# Patient Record
Sex: Male | Born: 1935 | Race: White | Hispanic: No | Smoking: Former smoker
Health system: Southern US, Community
[De-identification: ages and names within clinical notes are randomized; demographics above are authoritative.]

## PROBLEM LIST (undated history)

## (undated) DIAGNOSIS — R413 Other amnesia: Secondary | ICD-10-CM

## (undated) DIAGNOSIS — R269 Unspecified abnormalities of gait and mobility: Secondary | ICD-10-CM

## (undated) DIAGNOSIS — F0280 Dementia in other diseases classified elsewhere without behavioral disturbance: Secondary | ICD-10-CM

## (undated) DIAGNOSIS — G309 Alzheimer's disease, unspecified: Secondary | ICD-10-CM

## (undated) DIAGNOSIS — N400 Enlarged prostate without lower urinary tract symptoms: Secondary | ICD-10-CM

## (undated) DIAGNOSIS — R5383 Other fatigue: Secondary | ICD-10-CM

## (undated) DIAGNOSIS — R399 Unspecified symptoms and signs involving the genitourinary system: Secondary | ICD-10-CM

## (undated) HISTORY — PX: BONY PELVIS SURGERY: SHX572

## (undated) HISTORY — DX: Other amnesia: R41.3

## (undated) HISTORY — DX: Unspecified symptoms and signs involving the genitourinary system: R39.9

## (undated) HISTORY — PX: FOOT SURGERY: SHX648

## (undated) HISTORY — DX: Unspecified abnormalities of gait and mobility: R26.9

## (undated) HISTORY — DX: Other fatigue: R53.83

## (undated) HISTORY — DX: Benign prostatic hyperplasia without lower urinary tract symptoms: N40.0

---

## 2012-11-07 ENCOUNTER — Emergency Department: Payer: Self-pay | Admitting: Emergency Medicine

## 2014-07-15 DIAGNOSIS — R5383 Other fatigue: Secondary | ICD-10-CM | POA: Diagnosis not present

## 2014-07-15 DIAGNOSIS — N401 Enlarged prostate with lower urinary tract symptoms: Secondary | ICD-10-CM | POA: Diagnosis not present

## 2014-07-15 DIAGNOSIS — N400 Enlarged prostate without lower urinary tract symptoms: Secondary | ICD-10-CM | POA: Diagnosis not present

## 2014-11-19 DIAGNOSIS — Z1389 Encounter for screening for other disorder: Secondary | ICD-10-CM | POA: Diagnosis not present

## 2014-11-19 DIAGNOSIS — Z136 Encounter for screening for cardiovascular disorders: Secondary | ICD-10-CM | POA: Diagnosis not present

## 2014-11-19 DIAGNOSIS — Z131 Encounter for screening for diabetes mellitus: Secondary | ICD-10-CM | POA: Diagnosis not present

## 2014-11-19 DIAGNOSIS — R42 Dizziness and giddiness: Secondary | ICD-10-CM | POA: Diagnosis not present

## 2014-11-19 DIAGNOSIS — R413 Other amnesia: Secondary | ICD-10-CM | POA: Diagnosis not present

## 2014-12-03 DIAGNOSIS — Z6824 Body mass index (BMI) 24.0-24.9, adult: Secondary | ICD-10-CM | POA: Diagnosis not present

## 2014-12-03 DIAGNOSIS — F99 Mental disorder, not otherwise specified: Secondary | ICD-10-CM | POA: Diagnosis not present

## 2014-12-03 DIAGNOSIS — R42 Dizziness and giddiness: Secondary | ICD-10-CM | POA: Diagnosis not present

## 2014-12-03 DIAGNOSIS — E860 Dehydration: Secondary | ICD-10-CM | POA: Diagnosis not present

## 2014-12-24 ENCOUNTER — Ambulatory Visit (INDEPENDENT_AMBULATORY_CARE_PROVIDER_SITE_OTHER): Payer: Medicare Other | Admitting: Neurology

## 2014-12-24 ENCOUNTER — Encounter: Payer: Self-pay | Admitting: Neurology

## 2014-12-24 VITALS — BP 137/93 | HR 107 | Ht 63.0 in | Wt 150.8 lb

## 2014-12-24 DIAGNOSIS — R269 Unspecified abnormalities of gait and mobility: Secondary | ICD-10-CM

## 2014-12-24 DIAGNOSIS — R413 Other amnesia: Secondary | ICD-10-CM

## 2014-12-24 HISTORY — DX: Unspecified abnormalities of gait and mobility: R26.9

## 2014-12-24 HISTORY — DX: Other amnesia: R41.3

## 2014-12-24 NOTE — Progress Notes (Signed)
Reason for visit: Memory disorder  Referring physician: Jarmon Javid is a 81 y.o. male  History of present illness:  Mr. Hedberg is an 81 year old right-handed white male with a history of a progressive memory disorder over the last 6-12 months prior to this evaluation. The patient lives with his son, and his daughter lives next door. He has had increasing issues with short-term memory. 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 The patient has had difficulty with knowing where his house is when he leaves his daughter's house even know he lives next door. He requires assistance to manage his finances, and his daughter has had to help him with medications and appointments recently. The patient has had some episodes of dizziness, with a tendency to veer while walking. The patient has had blood work that reveals a normal B12 level. He feels somewhat weak and fatigued. The patient denies any issues controlling the bowels or the bladder. He denies any focal numbness or weakness of the face, arms, or legs. He is sent to this office for an evaluation.  Past Medical History  Diagnosis Date  . BPH (benign prostatic hyperplasia)   . Memory loss   . Memory deficits 12/24/2014  . Abnormality of gait 12/24/2014    Past Surgical History  Procedure Laterality Date  . Bony pelvis surgery      Family History  Problem Relation Age of Onset  . Cancer Sister   . Cancer Sister     Social history:  reports that he quit smoking about 58 years ago. His smoking use included Cigarettes. He has a 20 pack-year smoking history. He does not have any smokeless tobacco history on file. His alcohol and drug histories are not on file.  Medications:  Prior to Admission medications   Medication Sig Start Date End Date Taking? Authorizing Provider  aspirin  325 MG tablet Take 325 mg by mouth as needed.   Yes Historical Provider, MD  Calcium Carbonate-Vitamin D (CALCIUM-VITAMIN D) 600-125 MG-UNIT TABS Take 1 tablet by mouth daily.   Yes Historical Provider, MD  tamsulosin (FLOMAX) 0.4 MG CAPS capsule Take 0.4 mg by mouth daily.   Yes Historical Provider, MD     No Known Allergies  ROS:  Out of a complete 14 system review of symptoms, the patient complains only of the following symptoms, and all other reviewed systems are negative.  Fatigue Hearing loss, ringing in the ears, spinning sensations Shortness of breath Easy bruising Feeling cold, increased thirst Memory loss, confusion, numbness, weakness, slurred speech, dizziness Decreased energy, disinterest in activities Snoring  Blood pressure 137/93, pulse 107, height 5\' 3"  (1.6 m), weight 150 lb 12.8 oz (68.402 kg).  Physical Exam  General: The patient is alert and cooperative at the time of the examination.  Eyes: Pupils are equal, round, and reactive to light. Discs are flat bilaterally.  Neck: The neck is supple, no carotid bruits are noted.  Respiratory: The respiratory examination is clear.  Cardiovascular: The cardiovascular examination reveals a regular rate and rhythm, no obvious murmurs or rubs are noted.  Skin: Extremities are without significant edema.  Neurologic Exam  Mental status: The patient is alert and oriented x 2 at the time of the examination (not oriented to date). The Mini-Mental Status Examination done today shows a total score  of 18/30.   Cranial nerves: Facial symmetry is present. There is good sensation of the face to pinprick and soft touch bilaterally. The strength of the facial muscles and the muscles to head turning and shoulder shrug are normal bilaterally. Speech is well enunciated, no aphasia or dysarthria is noted. Extraocular movements are full. Visual fields are full. The tongue is midline, and the patient has symmetric elevation of the soft  palate. No obvious hearing deficits are noted.  Motor: The motor testing reveals 5 over 5 strength of all 4 extremities. Good symmetric motor tone is noted throughout.  Sensory: Sensory testing is intact to pinprick, soft touch, vibration sensation, and position sense on all 4 extremities, with exception that there is a stocking pattern pinprick sensory deficit up to the knees bilaterally and vibration sensation is impaired in both feet. No evidence of extinction is noted.  Coordination: Cerebellar testing reveals good finger-nose-finger and heel-to-shin bilaterally.  Gait and station: Gait is normal. Tandem gait is unsteady. Romberg is negative. No drift is seen.  Reflexes: Deep tendon reflexes are symmetric, but are depressed bilaterally. Toes are downgoing bilaterally.   Assessment/Plan  1. Progressive memory disturbance  2. Gait disorder  The patient is having issues with balance, some dizziness, as well as some memory issues. The patient continues to operate a motor vehicle, I have recommended that he not drive at this time. The patient will be sent for MRI evaluation of the brain. I discussed the possibility of going on medication for memory, the patient is not interested in doing this at this time. He will follow-up in 6 months for an evaluation.   Marlan Palau MD 12/24/2014 7:33 PM  [facility] [address] [place], [place] [address]  Phone [phone] Fax [phone]

## 2014-12-24 NOTE — Patient Instructions (Addendum)
We will check a MRI of the brain, if you decide you want to go on a medication for memory, please let me know.   Fall Prevention and Home Safety Falls cause injuries and can affect all age groups. It is possible to use preventive measures to significantly decrease the likelihood of falls. There are many simple measures which can make your home safer and prevent falls. OUTDOORS  Repair cracks and edges of walkways and driveways.  Remove high doorway thresholds.  Trim shrubbery on the main path into your home.  Have good outside lighting.  Clear walkways of tools, rocks, debris, and clutter.  Check that handrails are not broken and are securely fastened. Both sides of steps should have handrails.  Have leaves, snow, and ice cleared regularly.  Use sand or salt on walkways during winter months.  In the garage, clean up grease or oil spills. BATHROOM  Install night lights.  Install grab bars by the toilet and in the tub and shower.  Use non-skid mats or decals in the tub or shower.  Place a plastic non-slip stool in the shower to sit on, if needed.  Keep floors dry and clean up all water on the floor immediately.  Remove soap buildup in the tub or shower on a regular basis.  Secure bath mats with non-slip, double-sided rug tape.  Remove throw rugs and tripping hazards from the floors. BEDROOMS  Install night lights.  Make sure a bedside light is easy to reach.  Do not use oversized bedding.  Keep a telephone by your bedside.  Have a firm chair with side arms to use for getting dressed.  Remove throw rugs and tripping hazards from the floor. KITCHEN  Keep handles on pots and pans turned toward the center of the stove. Use back burners when possible.  Clean up spills quickly and allow time for drying.  Avoid walking on wet floors.  Avoid hot utensils and knives.  Position shelves so they are not too high or low.  Place commonly used objects within easy  reach.  If necessary, use a sturdy step stool with a grab bar when reaching.  Keep electrical cables out of the way.  Do not use floor polish or wax that makes floors slippery. If you must use wax, use non-skid floor wax.  Remove throw rugs and tripping hazards from the floor. STAIRWAYS  Never leave objects on stairs.  Place handrails on both sides of stairways and use them. Fix any loose handrails. Make sure handrails on both sides of the stairways are as long as the stairs.  Check carpeting to make sure it is firmly attached along stairs. Make repairs to worn or loose carpet promptly.  Avoid placing throw rugs at the top or bottom of stairways, or properly secure the rug with carpet tape to prevent slippage. Get rid of throw rugs, if possible.  Have an electrician put in a light switch at the top and bottom of the stairs. OTHER FALL PREVENTION TIPS  Wear low-heel or rubber-soled shoes that are supportive and fit well. Wear closed toe shoes.  When using a stepladder, make sure it is fully opened and both spreaders are firmly locked. Do not climb a closed stepladder.  Add color or contrast paint or tape to grab bars and handrails in your home. Place contrasting color strips on first and last steps.  Learn and use mobility aids as needed. Install an electrical emergency response system.  Turn on lights to avoid  dark areas. Replace light bulbs that burn out immediately. Get light switches that glow.  Arrange furniture to create clear pathways. Keep furniture in the same place.  Firmly attach carpet with non-skid or double-sided tape.  Eliminate uneven floor surfaces.  Select a carpet pattern that does not visually hide the edge of steps.  Be aware of all pets. OTHER HOME SAFETY TIPS  Set the water temperature for 120 F (48.8 C).  Keep emergency numbers on or near the telephone.  Keep smoke detectors on every level of the home and near sleeping areas. Document Released:  05/05/2002 Document Revised: 11/14/2011 Document Reviewed: 08/04/2011 [facility] Patient Information 2015 [employer] [place], [place]. This information is not intended to replace advice given to you by your health care provider. Make sure you discuss any questions you have with your health care provider.

## 2014-12-25 ENCOUNTER — Telehealth: Payer: Self-pay

## 2014-12-25 NOTE — Telephone Encounter (Signed)
Office note faxed to Heide Guile at [phone].

## 2015-01-04 ENCOUNTER — Inpatient Hospital Stay: Admission: RE | Admit: 2015-01-04 | Payer: Self-pay | Source: Ambulatory Visit

## 2015-02-19 DIAGNOSIS — R413 Other amnesia: Secondary | ICD-10-CM | POA: Diagnosis not present

## 2015-02-19 DIAGNOSIS — R42 Dizziness and giddiness: Secondary | ICD-10-CM | POA: Diagnosis not present

## 2015-02-19 DIAGNOSIS — Z1389 Encounter for screening for other disorder: Secondary | ICD-10-CM | POA: Diagnosis not present

## 2015-02-19 DIAGNOSIS — Z6824 Body mass index (BMI) 24.0-24.9, adult: Secondary | ICD-10-CM | POA: Diagnosis not present

## 2015-02-19 DIAGNOSIS — Z23 Encounter for immunization: Secondary | ICD-10-CM | POA: Diagnosis not present

## 2015-02-19 DIAGNOSIS — Z9181 History of falling: Secondary | ICD-10-CM | POA: Diagnosis not present

## 2015-02-19 DIAGNOSIS — Z139 Encounter for screening, unspecified: Secondary | ICD-10-CM | POA: Diagnosis not present

## 2015-05-27 DIAGNOSIS — R631 Polydipsia: Secondary | ICD-10-CM | POA: Diagnosis not present

## 2015-05-27 DIAGNOSIS — G301 Alzheimer's disease with late onset: Secondary | ICD-10-CM | POA: Diagnosis not present

## 2015-05-27 DIAGNOSIS — G4700 Insomnia, unspecified: Secondary | ICD-10-CM | POA: Diagnosis not present

## 2015-05-27 DIAGNOSIS — F99 Mental disorder, not otherwise specified: Secondary | ICD-10-CM | POA: Diagnosis not present

## 2015-05-27 DIAGNOSIS — G309 Alzheimer's disease, unspecified: Secondary | ICD-10-CM | POA: Diagnosis not present

## 2015-05-27 DIAGNOSIS — Z23 Encounter for immunization: Secondary | ICD-10-CM | POA: Diagnosis not present

## 2015-06-16 ENCOUNTER — Encounter: Payer: Self-pay | Admitting: Emergency Medicine

## 2015-06-16 ENCOUNTER — Emergency Department: Payer: Medicare Other

## 2015-06-16 ENCOUNTER — Emergency Department
Admission: EM | Admit: 2015-06-16 | Discharge: 2015-06-16 | Disposition: A | Discharge status: Home / Self Care | Payer: Medicare Other | Attending: Emergency Medicine | Admitting: Emergency Medicine

## 2015-06-16 DIAGNOSIS — S52022A Displaced fracture of olecranon process without intraarticular extension of left ulna, initial encounter for closed fracture: Secondary | ICD-10-CM | POA: Diagnosis not present

## 2015-06-16 DIAGNOSIS — Z79899 Other long term (current) drug therapy: Secondary | ICD-10-CM | POA: Diagnosis not present

## 2015-06-16 DIAGNOSIS — Y998 Other external cause status: Secondary | ICD-10-CM | POA: Diagnosis not present

## 2015-06-16 DIAGNOSIS — S59902A Unspecified injury of left elbow, initial encounter: Secondary | ICD-10-CM | POA: Diagnosis present

## 2015-06-16 DIAGNOSIS — W108XXA Fall (on) (from) other stairs and steps, initial encounter: Secondary | ICD-10-CM | POA: Insufficient documentation

## 2015-06-16 DIAGNOSIS — Y9301 Activity, walking, marching and hiking: Secondary | ICD-10-CM | POA: Insufficient documentation

## 2015-06-16 DIAGNOSIS — Y92009 Unspecified place in unspecified non-institutional (private) residence as the place of occurrence of the external cause: Secondary | ICD-10-CM | POA: Diagnosis not present

## 2015-06-16 DIAGNOSIS — Z87891 Personal history of nicotine dependence: Secondary | ICD-10-CM | POA: Diagnosis not present

## 2015-06-16 DIAGNOSIS — M25422 Effusion, left elbow: Secondary | ICD-10-CM | POA: Insufficient documentation

## 2015-06-16 DIAGNOSIS — Z7982 Long term (current) use of aspirin: Secondary | ICD-10-CM | POA: Insufficient documentation

## 2015-06-16 HISTORY — DX: Alzheimer's disease, unspecified: G30.9

## 2015-06-16 HISTORY — DX: Dementia in other diseases classified elsewhere, unspecified severity, without behavioral disturbance, psychotic disturbance, mood disturbance, and anxiety: F02.80

## 2015-06-16 MED ORDER — HYDROCODONE-ACETAMINOPHEN 5-325 MG PO TABS: 1.0000 | ORAL_TABLET | Freq: Four times a day (QID) | ORAL | Status: DC | PRN

## 2015-06-16 NOTE — ED Notes (Signed)
Patient presents to the ED with very swollen left elbow post fall.  Patient states he was carrying firewood up several steps and tripped and fell backward.  Patient denies losing consciousness and denies hitting his head.  Patient denies pain from the elbow and denies tenderness to touch but reports feeling slightly numb in his fingers on his left hand.  Mobility intact in fingers.

## 2015-06-16 NOTE — ED Notes (Signed)
Patient transported to X-ray 

## 2015-06-16 NOTE — Discharge Instructions (Signed)
Cast or Splint Care Casts and splints support injured limbs and keep bones from moving while they heal.  HOME CARE  Keep the cast or splint uncovered during the drying period.  A plaster cast can take 24 to 48 hours to dry.  A fiberglass cast will dry in less than 1 hour.  Do not rest the cast on anything harder than a pillow for 24 hours.  Do not put weight on your injured limb. Do not put pressure on the cast. Wait for your doctor's approval.  Keep the cast or splint dry.  Cover the cast or splint with a plastic bag during baths or wet weather.  If you have a cast over your chest and belly (trunk), take sponge baths until the cast is taken off.  If your cast gets wet, dry it with a towel or blow dryer. Use the cool setting on the blow dryer.  Keep your cast or splint clean. Wash a dirty cast with a damp cloth.  Do not put any objects under your cast or splint.  Do not scratch the skin under the cast with an object. If itching is a problem, use a blow dryer on a cool setting over the itchy area.  Do not trim or cut your cast.  Do not take out the padding from inside your cast.  Exercise your joints near the cast as told by your doctor.  Raise (elevate) your injured limb on 1 or 2 pillows for the first 1 to 3 days. GET HELP IF:  Your cast or splint cracks.  Your cast or splint is too tight or too loose.  You itch badly under the cast.  Your cast gets wet or has a soft spot.  You have a bad smell coming from the cast.  You get an object stuck under the cast.  Your skin around the cast becomes red or sore.  You have new or more pain after the cast is put on. GET HELP RIGHT AWAY IF:  You have fluid leaking through the cast.  You cannot move your fingers or toes.  Your fingers or toes turn blue or white or are cool, painful, or puffy (swollen).  You have tingling or lose feeling (numbness) around the injured area.  You have bad pain or pressure under the  cast.  You have trouble breathing or have shortness of breath.  You have chest pain.   This information is not intended to replace advice given to you by your health care provider. Make sure you discuss any questions you have with your health care provider.   Document Released: 09/14/2010 Document Revised: 01/15/2013 Document Reviewed: 11/21/2012 Elsevier Interactive Patient Education 2016 [employer]  ELBOW FRACTURE  An elbow fracture is a break in a bone or bones of your elbow. Your elbow is a hinged joint that is made up of three bones. These bones are the long bone in your upper arm (humerus), the bone in the outer part of your lower arm (radius), and the bone in the inner part of your lower arm (ulna). The tip of your elbow (olecranon) is part of your ulna. If the fracture is displaced, that means that the bones are not lined up correctly and may be unstable. The bones will be put back into position with a procedure that is called open reduction with internal fixation (ORIF). A combination of metal pins, screws with or without a metal plate, or different types of wiring are used to hold the  bones in place. LET [facility] CARE PROVIDER KNOW ABOUT:  Any allergies you have.  All medicines you are taking, including vitamins, herbs, eye drops, creams, and over-the-counter medicines.  Previous problems you or members of your family have had with the use of anesthetics.  Any blood disorders you have.  Previous surgeries you have had.  Any medical conditions you may have. RISKS AND COMPLICATIONS Generally, this is a safe procedure. However, problems may occur, including:  Bleeding.  Infection.  Elbow stiffness.  Nerve damage.  Blood vessel damage.  Loosening or breaking of the screws or plates that are used for internal fixation.  Failure of the fracture to heal properly.  Need to have surgery again. BEFORE THE PROCEDURE  Ask your health care provider  about:  Changing or stopping your regular medicines. This is especially important if you are taking diabetes medicines or blood thinners.  Taking medicines such as aspirin and ibuprofen. These medicines can thin your blood. Do not take these medicines before your procedure if your health care provider instructs you not to.  Do not drink alcohol before your procedure or as directed by your health care provider.  Do not use any tobacco products, including cigarettes, chewing tobacco, or e-cigarettes before the procedure or as directed by your health care provider. If you need help quitting, ask your health care provider. Using tobacco products can delay or prevent bone healing.  Follow instructions from your health care provider about eating or drinking restrictions.  Plan to have someone take you home after the procedure.  If you go home right after the procedure, plan to have someone with you for 24 hours. PROCEDURE  An IV tube will be inserted into one of your veins.  You will be given one or more of the following:  A medicine that helps you relax (sedative).  A medicine that makes you fall asleep (general anesthetic).  A medicine that is injected into an area of your body that numbs everything below the injection site (regional anesthetic).  Your arm will be cleaned with a germ-killing solution (antiseptic) and covered with sterile cloths.  Your surgeon will make an incision in your elbow over the fracture.  The structures around your elbow will be moved aside carefully to expose your fracture.  The bone pieces will be put back into their normal positions.  Your surgeon may use metal screws, wires, pins, or plates to hold the bone pieces in position.  After the bones are back in place, the incision will be closed with stitches (sutures) or staples.  A bandage (dressing) will be placed over the incision.  A splint may be placed on your arm. The procedure may vary among  health care providers and hospitals. AFTER THE PROCEDURE  Your blood pressure, heart rate, breathing rate, and blood oxygen level will be monitored often until the medicines you were given have worn off.  It is normal to have pain after the procedure. You will be given medicine for pain as needed.  You may be sent home with a sling to support your arm.   This information is not intended to replace advice given to you by your health care provider. Make sure you discuss any questions you have with your health care provider.   Document Released: 11/08/2000 Document Revised: 09/29/2014 Document Reviewed: 05/11/2014 Elsevier Interactive Patient Education [employer]  Keep the splint in place until evaluated by ortho. Give OTC Tylenol or Motrin for non-drowsy pain relief. Give the  Hydrocodone as needed for more severe pain. Consider give a stool softener (Miralax) along with the narcotic pain medicine.

## 2015-06-16 NOTE — ED Provider Notes (Signed)
[facility] Emergency Department Provider Note ____________________________________________  Time seen: 71  I have reviewed the triage vital signs and the nursing notes.  HISTORY  Chief Complaint  Joint Swelling  HPI Johnathan Foster is a 82 y.o. male presents to the ED, accompanied by his adult daughter for evaluation of injury to his left elbow. He was carrying wood into the house for the fireplace, when he accidentally fell while trying to walk up the steps. He landed backwards landing on his left elbow primarily. His daughter noted immediate swelling to the elbow. He denies any other injury including injury to the head, back, or any lacerations. There was no reported loss of consciousness, nausea, vomiting.  He denies any significant pain at this time.   Past Medical History  Diagnosis Date  . BPH (benign prostatic hyperplasia)   . Memory loss   . Memory deficits 12/24/2014  . Abnormality of gait 12/24/2014  . Alzheimer's dementia     Patient Active Problem List   Diagnosis Date Noted  . Memory deficits 12/24/2014  . Abnormality of gait 12/24/2014    Past Surgical History  Procedure Laterality Date  . Bony pelvis surgery      Current Outpatient Rx  Name  Route  Sig  Dispense  Refill  . aspirin 325 MG tablet   Oral   Take 325 mg by mouth as needed.         . Calcium Carbonate-Vitamin D (CALCIUM-VITAMIN D) 600-125 MG-UNIT TABS   Oral   Take 1 tablet by mouth daily.         Marland Kitchen HYDROcodone-acetaminophen (NORCO) 5-325 MG tablet   Oral   Take 1 tablet by mouth every 6 (six) hours as needed for moderate pain.   10 tablet   0   . tamsulosin (FLOMAX) 0.4 MG CAPS capsule   Oral   Take 0.4 mg by mouth daily.           Allergies Review of patient's allergies indicates no known allergies.  Family History  Problem Relation Age of Onset  . Cancer Sister   . Cancer Sister     Social History Social History  Substance Use Topics  .  Smoking status: Former Smoker -- 1.00 packs/day for 20 years    Types: Cigarettes    Quit date: 05/29/1956  . Smokeless tobacco: None  . Alcohol Use: None    Review of Systems  Constitutional: Negative for fever. Eyes: Negative for visual changes. ENT: Negative for sore throat. Cardiovascular: Negative for chest pain. Respiratory: Negative for shortness of breath. Gastrointestinal: Negative for abdominal pain, vomiting and diarrhea. Genitourinary: Negative for dysuria. Musculoskeletal: Negative for back pain. Left elbow pain as above. Skin: Negative for rash. Neurological: Negative for headaches, focal weakness or numbness. ____________________________________________  PHYSICAL EXAM:  VITAL SIGNS: ED Triage Vitals  Enc Vitals Group     BP 06/16/15 1852 139/92 mmHg     Pulse Rate 06/16/15 1852 92     Resp 06/16/15 1852 20     Temp 06/16/15 1852 97.7 F (36.5 C)     Temp Source 06/16/15 1852 Oral     SpO2 06/16/15 1852 100 %     Weight 06/16/15 1852 150 lb (68.04 kg)     Height 06/16/15 1852  (1.651 m)     Head Cir --      Peak Flow --      Pain Score 06/16/15 1853 0     Pain Loc --  Pain Edu? --      Excl. in GC? --    Constitutional: Alert and oriented. Well appearing and in no distress. Head: Normocephalic and atraumatic.      Eyes: Conjunctivae are normal. PERRL. Normal extraocular movements      Ears: Canals clear. TMs intact bilaterally.   Nose: No congestion/rhinorrhea.   Mouth/Throat: Mucous membranes are moist.   Neck: Supple. No thyromegaly. Hematological/Lymphatic/Immunological: No cervical lymphadenopathy. Cardiovascular: Normal rate, regular rhythm. Normal distal pulses left upper extremity. Normal capillary refill. Respiratory: Normal respiratory effort. No wheezes/rales/rhonchi. Gastrointestinal: Soft and nontender. No distention. Musculoskeletal: Left elbow without obvious deformity, but a moderate joint effusion is appreciated.  Patient with elbow in the flexed position secondary to position in the chair.  Nontender with normal range of motion in all extremities.  Neurologic: Normal gross sensation. Cranial nerves II through XII grossly intact. Normal gait without ataxia. Normal speech and language. No gross focal neurologic deficits are appreciated. Skin:  Skin is warm, dry and intact. No rash noted. Psychiatric: Mood and affect are normal. Patient exhibits appropriate insight and judgment. ____________________________________________   RADIOLOGY  Left Elbow  IMPRESSION: Comminuted and significantly displaced olecranon fracture of the proximal ulna.  I, Evah Rashid, Charlesetta Ivory, personally viewed and evaluated these images (plain radiographs) as part of my medical decision making, as well as reviewing the written report by the radiologist. ____________________________________________  PROCEDURES  Posterior Arm Splint Arm Sling ____________________________________________  INITIAL IMPRESSION / ASSESSMENT AND PLAN / ED COURSE  ----------------------------------------- 8:01 PM on 06/16/2015 -----------------------------------------  Spoke with Dr. Joice Lofts: splint as above. Have patient call office tomorrow for appointment for either Friday or Monday.   Closed, comminuted, and displaced left olecranon fracture. Initial fracture care provided. Splint and sling applied. Follow-up with ortho as discussed. Prescription for Norco #10 as needed for pain. Apply ice through splint.  ____________________________________________  FINAL CLINICAL IMPRESSION(S) / ED DIAGNOSES  Final diagnoses:  Closed fracture of olecranon process of ulna, left, initial encounter  Effusion of elbow, left     Lissa Hoard, PA-C 06/16/15 2208  Sharman Cheek, MD 06/16/15 2316

## 2015-06-17 DIAGNOSIS — E663 Overweight: Secondary | ICD-10-CM | POA: Diagnosis not present

## 2015-06-17 DIAGNOSIS — S52022D Displaced fracture of olecranon process without intraarticular extension of left ulna, subsequent encounter for closed fracture with routine healing: Secondary | ICD-10-CM | POA: Diagnosis not present

## 2015-06-22 ENCOUNTER — Encounter
Admission: AD | Disposition: A | Discharge status: Home / Self Care | Payer: Self-pay | Source: Ambulatory Visit | Attending: Surgery

## 2015-06-22 ENCOUNTER — Observation Stay
Admission: AD | Admit: 2015-06-22 | Discharge: 2015-06-23 | Disposition: A | Discharge status: Home / Self Care | Payer: Medicare Other | Source: Ambulatory Visit | Attending: Surgery | Admitting: Surgery

## 2015-06-22 ENCOUNTER — Ambulatory Visit: Payer: Medicare Other | Admitting: Certified Registered Nurse Anesthetist

## 2015-06-22 ENCOUNTER — Encounter: Payer: Self-pay | Admitting: *Deleted

## 2015-06-22 ENCOUNTER — Ambulatory Visit: Payer: Medicare Other

## 2015-06-22 DIAGNOSIS — R413 Other amnesia: Secondary | ICD-10-CM | POA: Diagnosis not present

## 2015-06-22 DIAGNOSIS — Z79899 Other long term (current) drug therapy: Secondary | ICD-10-CM | POA: Diagnosis not present

## 2015-06-22 DIAGNOSIS — Z419 Encounter for procedure for purposes other than remedying health state, unspecified: Secondary | ICD-10-CM

## 2015-06-22 DIAGNOSIS — W109XXA Fall (on) (from) unspecified stairs and steps, initial encounter: Secondary | ICD-10-CM | POA: Diagnosis not present

## 2015-06-22 DIAGNOSIS — F0280 Dementia in other diseases classified elsewhere without behavioral disturbance: Secondary | ICD-10-CM | POA: Insufficient documentation

## 2015-06-22 DIAGNOSIS — R269 Unspecified abnormalities of gait and mobility: Secondary | ICD-10-CM | POA: Insufficient documentation

## 2015-06-22 DIAGNOSIS — S52022A Displaced fracture of olecranon process without intraarticular extension of left ulna, initial encounter for closed fracture: Secondary | ICD-10-CM | POA: Diagnosis not present

## 2015-06-22 DIAGNOSIS — G309 Alzheimer's disease, unspecified: Secondary | ICD-10-CM | POA: Insufficient documentation

## 2015-06-22 DIAGNOSIS — N400 Enlarged prostate without lower urinary tract symptoms: Secondary | ICD-10-CM | POA: Diagnosis not present

## 2015-06-22 DIAGNOSIS — Z9889 Other specified postprocedural states: Secondary | ICD-10-CM | POA: Diagnosis not present

## 2015-06-22 DIAGNOSIS — S52032A Displaced fracture of olecranon process with intraarticular extension of left ulna, initial encounter for closed fracture: Secondary | ICD-10-CM | POA: Diagnosis not present

## 2015-06-22 DIAGNOSIS — Z7982 Long term (current) use of aspirin: Secondary | ICD-10-CM | POA: Diagnosis not present

## 2015-06-22 HISTORY — PX: ORIF ELBOW FRACTURE: SHX5031

## 2015-06-22 SURGERY — OPEN REDUCTION INTERNAL FIXATION (ORIF) ELBOW/OLECRANON FRACTURE
Anesthesia: General | Site: Elbow | Laterality: Left | Wound class: Clean

## 2015-06-22 MED ORDER — ONDANSETRON HCL 4 MG/2ML IJ SOLN
INTRAMUSCULAR | Status: AC
  Filled 2015-06-22: qty 2

## 2015-06-22 MED ORDER — HYDROCODONE-ACETAMINOPHEN 5-325 MG PO TABS: 1.0000 | ORAL_TABLET | Freq: Four times a day (QID) | ORAL | Status: DC | PRN

## 2015-06-22 MED ORDER — HYDRALAZINE HCL 20 MG/ML IJ SOLN
10.0000 mg | Freq: Once | INTRAMUSCULAR | Status: AC
Start: 2015-06-22 — End: 2015-06-22
  Administered 2015-06-22: 10 mg via INTRAVENOUS

## 2015-06-22 MED ORDER — ZOLPIDEM TARTRATE 5 MG PO TABS
5.0000 mg | ORAL_TABLET | Freq: Every evening | ORAL | Status: DC | PRN
  Administered 2015-06-22: 5 mg via ORAL
  Filled 2015-06-22: qty 1

## 2015-06-22 MED ORDER — SUCCINYLCHOLINE CHLORIDE 20 MG/ML IJ SOLN
INTRAMUSCULAR | Status: DC | PRN
  Administered 2015-06-22: 100 mg via INTRAVENOUS

## 2015-06-22 MED ORDER — NEOMYCIN-POLYMYXIN B GU 40-200000 IR SOLN
Status: DC | PRN
  Administered 2015-06-22: 2 mL

## 2015-06-22 MED ORDER — ONDANSETRON HCL 4 MG/2ML IJ SOLN: 4.0000 mg | Freq: Four times a day (QID) | INTRAMUSCULAR | Status: DC | PRN

## 2015-06-22 MED ORDER — ONDANSETRON HCL 4 MG/2ML IJ SOLN
4.0000 mg | Freq: Four times a day (QID) | INTRAMUSCULAR | Status: DC | PRN
  Administered 2015-06-23: 4 mg via INTRAVENOUS
  Filled 2015-06-22: qty 2

## 2015-06-22 MED ORDER — ROCURONIUM BROMIDE 100 MG/10ML IV SOLN
INTRAVENOUS | Status: DC | PRN
  Administered 2015-06-22: 50 mg via INTRAVENOUS

## 2015-06-22 MED ORDER — LIDOCAINE HCL (CARDIAC) 20 MG/ML IV SOLN
INTRAVENOUS | Status: DC | PRN
  Administered 2015-06-22: 100 mg via INTRAVENOUS

## 2015-06-22 MED ORDER — LACTATED RINGERS IV SOLN: INTRAVENOUS | Status: DC

## 2015-06-22 MED ORDER — FENTANYL CITRATE (PF) 100 MCG/2ML IJ SOLN
INTRAMUSCULAR | Status: DC | PRN
Start: 2015-06-22 — End: 2015-06-22
  Administered 2015-06-22 (×2): 50 ug via INTRAVENOUS
  Administered 2015-06-22: 100 ug via INTRAVENOUS

## 2015-06-22 MED ORDER — FENTANYL CITRATE (PF) 100 MCG/2ML IJ SOLN
25.0000 ug | INTRAMUSCULAR | Status: DC | PRN
  Administered 2015-06-22 (×3): 25 ug via INTRAVENOUS

## 2015-06-22 MED ORDER — METOCLOPRAMIDE HCL 10 MG PO TABS: 5.0000 mg | ORAL_TABLET | Freq: Three times a day (TID) | ORAL | Status: DC | PRN

## 2015-06-22 MED ORDER — DEXAMETHASONE SODIUM PHOSPHATE 10 MG/ML IJ SOLN
INTRAMUSCULAR | Status: DC | PRN
  Administered 2015-06-22: 10 mg via INTRAVENOUS

## 2015-06-22 MED ORDER — PROPOFOL 10 MG/ML IV BOLUS
INTRAVENOUS | Status: DC | PRN
  Administered 2015-06-22: 90 mg via INTRAVENOUS

## 2015-06-22 MED ORDER — POTASSIUM CHLORIDE IN NACL 20-0.9 MEQ/L-% IV SOLN
INTRAVENOUS | Status: DC
  Administered 2015-06-22: 22:00:00 via INTRAVENOUS
  Filled 2015-06-22 (×4): qty 1000

## 2015-06-22 MED ORDER — CEFAZOLIN SODIUM-DEXTROSE 2-3 GM-% IV SOLR
INTRAVENOUS | Status: AC
  Administered 2015-06-22: 2 g via INTRAVENOUS
  Filled 2015-06-22: qty 50

## 2015-06-22 MED ORDER — CEFAZOLIN SODIUM-DEXTROSE 2-3 GM-% IV SOLR: 2.0000 g | Freq: Once | INTRAVENOUS | Status: DC

## 2015-06-22 MED ORDER — HYDROMORPHONE HCL 1 MG/ML IJ SOLN: 0.2500 mg | INTRAMUSCULAR | Status: DC | PRN

## 2015-06-22 MED ORDER — SUGAMMADEX SODIUM 200 MG/2ML IV SOLN
INTRAVENOUS | Status: DC | PRN
  Administered 2015-06-22: 136 mg via INTRAVENOUS

## 2015-06-22 MED ORDER — FENTANYL CITRATE (PF) 100 MCG/2ML IJ SOLN
INTRAMUSCULAR | Status: AC
  Administered 2015-06-22: 25 ug via INTRAVENOUS
  Filled 2015-06-22: qty 2

## 2015-06-22 MED ORDER — ACETAMINOPHEN 325 MG PO TABS: 650.0000 mg | ORAL_TABLET | ORAL | Status: DC | PRN

## 2015-06-22 MED ORDER — BUPIVACAINE HCL 0.5 % IJ SOLN
INTRAMUSCULAR | Status: DC | PRN
  Administered 2015-06-22: 20 mL

## 2015-06-22 MED ORDER — ONDANSETRON HCL 4 MG PO TABS: 4.0000 mg | ORAL_TABLET | Freq: Four times a day (QID) | ORAL | Status: DC | PRN

## 2015-06-22 MED ORDER — HYDROCODONE-ACETAMINOPHEN 5-325 MG PO TABS: 1.0000 | ORAL_TABLET | ORAL | Status: DC | PRN

## 2015-06-22 MED ORDER — METOCLOPRAMIDE HCL 5 MG/ML IJ SOLN: 5.0000 mg | Freq: Three times a day (TID) | INTRAMUSCULAR | Status: DC | PRN

## 2015-06-22 MED ORDER — ONDANSETRON HCL 4 MG/2ML IJ SOLN
INTRAMUSCULAR | Status: DC | PRN
  Administered 2015-06-22: 4 mg via INTRAVENOUS

## 2015-06-22 MED ORDER — ONDANSETRON HCL 4 MG/2ML IJ SOLN
4.0000 mg | Freq: Once | INTRAMUSCULAR | Status: AC | PRN
  Administered 2015-06-22: 4 mg via INTRAVENOUS

## 2015-06-22 MED ORDER — LACTATED RINGERS IV SOLN
INTRAVENOUS | Status: DC
  Administered 2015-06-22 (×3): via INTRAVENOUS

## 2015-06-22 MED ORDER — HYDROCODONE-ACETAMINOPHEN 5-325 MG PO TABS
1.0000 | ORAL_TABLET | Freq: Four times a day (QID) | ORAL | Status: DC | PRN
  Administered 2015-06-22: 1 via ORAL
  Filled 2015-06-22: qty 1

## 2015-06-22 SURGICAL SUPPLY — 53 items
ANCHOR STAINLESS SURGICAL NEEDLES ×2 IMPLANT
BIT DRILL 2.5X2.75 QC CALB (BIT) ×2 IMPLANT
BIT DRILL CALIBRATED 2.7 (BIT) ×2 IMPLANT
BNDG COHESIVE 4X5 TAN STRL (GAUZE/BANDAGES/DRESSINGS) ×2 IMPLANT
BNDG ESMARK 4X12 TAN STRL LF (GAUZE/BANDAGES/DRESSINGS) ×2 IMPLANT
CANISTER SUCT 1200ML W/VALVE (MISCELLANEOUS) ×2 IMPLANT
CHLORAPREP W/TINT 26ML (MISCELLANEOUS) ×2 IMPLANT
DRAPE SHEET LG 3/4 BI-LAMINATE (DRAPES) ×4 IMPLANT
DRILL SLEEVE 2.7 DIST TIB (TRAUMA) ×1
ELECT CAUTERY BLADE 6.4 (BLADE) ×2 IMPLANT
ELECT REM PT RETURN 9FT ADLT (ELECTROSURGICAL) ×2
ELECTRODE REM PT RTRN 9FT ADLT (ELECTROSURGICAL) ×1 IMPLANT
GAUZE PETRO XEROFOAM 1X8 (MISCELLANEOUS) ×2 IMPLANT
GAUZE SPONGE 4X4 12PLY STRL (GAUZE/BANDAGES/DRESSINGS) ×2 IMPLANT
GLOVE BIO SURGEON STRL SZ8 (GLOVE) ×6 IMPLANT
GLOVE BIOGEL M 7.0 STRL (GLOVE) ×4 IMPLANT
GLOVE BIOGEL PI IND STRL 7.5 (GLOVE) ×1 IMPLANT
GLOVE BIOGEL PI INDICATOR 7.5 (GLOVE) ×1
GLOVE INDICATOR 8.0 STRL GRN (GLOVE) ×4 IMPLANT
GOWN STRL REUS W/ TWL LRG LVL3 (GOWN DISPOSABLE) ×1 IMPLANT
GOWN STRL REUS W/ TWL XL LVL3 (GOWN DISPOSABLE) ×1 IMPLANT
GOWN STRL REUS W/TWL LRG LVL3 (GOWN DISPOSABLE) ×1
GOWN STRL REUS W/TWL XL LVL3 (GOWN DISPOSABLE) ×1
NEEDLE FILTER BLUNT 18X 1/2SAF (NEEDLE) ×1
NEEDLE FILTER BLUNT 18X1 1/2 (NEEDLE) ×1 IMPLANT
NS IRRIG 500ML POUR BTL (IV SOLUTION) ×2 IMPLANT
PACK EXTREMITY ARMC (MISCELLANEOUS) ×2 IMPLANT
PAD ABD DERMACEA PRESS 5X9 (GAUZE/BANDAGES/DRESSINGS) ×4 IMPLANT
PAD CAST CTTN 4X4 STRL (SOFTGOODS) ×1 IMPLANT
PADDING CAST COTTON 4X4 STRL (SOFTGOODS) ×1
PLATE OLECRANON SM (Plate) ×2 IMPLANT
SCREW CORT T15 28X3.5XST LCK (Screw) ×1 IMPLANT
SCREW CORTICAL 3.5X28MM (Screw) ×1 IMPLANT
SCREW LOCK CORT STAR 3.5X18 (Screw) ×2 IMPLANT
SCREW LOCK CORT STAR 3.5X20 (Screw) ×2 IMPLANT
SCREW LOCK CORT STAR 3.5X22 (Screw) ×2 IMPLANT
SCREW LOCK CORT STAR 3.5X26 (Screw) ×4 IMPLANT
SCREW LOCK CORT STAR 3.5X30 (Screw) ×4 IMPLANT
SCREW LOCK CORT STAR 3.5X42 (Screw) ×2 IMPLANT
SLEEVE DRILL 2.7 DIST TIB (TRAUMA) ×1 IMPLANT
SLING ARM LRG DEEP (SOFTGOODS) ×2 IMPLANT
SPONGE LAP 18X18 5 PK (GAUZE/BANDAGES/DRESSINGS) ×2 IMPLANT
STAPLER SKIN PROX 35W (STAPLE) ×2 IMPLANT
STOCKINETTE IMPERVIOUS 9X36 MD (GAUZE/BANDAGES/DRESSINGS) ×2 IMPLANT
STRIP CLOSURE SKIN 1/2X4 (GAUZE/BANDAGES/DRESSINGS) ×2 IMPLANT
SUT FIBERWIRE #2 38 T-5 BLUE (SUTURE) ×2
SUT VIC AB 2-0 CT1 (SUTURE) ×2 IMPLANT
SUT VIC AB 2-0 CT2 27 (SUTURE) ×2 IMPLANT
SUT VIC AB 2-0 SH 27 (SUTURE) ×1
SUT VIC AB 2-0 SH 27XBRD (SUTURE) ×1 IMPLANT
SUT VIC AB 3-0 PS2 18 (SUTURE) ×2 IMPLANT
SUTURE FIBERWR #2 38 T-5 BLUE (SUTURE) ×1 IMPLANT
WASHER 3.5MM (Orthopedic Implant) ×2 IMPLANT

## 2015-06-22 NOTE — Op Note (Signed)
06/22/2015  5:41 PM  Patient:   Johnathan Foster  Pre-Op Diagnosis:   Closed displaced olecranon fracture, left elbow.  Post-Op Diagnosis:   Same.  Procedure:   Open reduction and internal fixation of displaced olecranon fracture, left elbow.  Surgeon:   Maryagnes Amos, MD  Assistant:   Alvester Chou, PA-S  Anesthesia:   GET  Findings:   As above.  Complications:   None  EBL:   50 cc  Fluids:   1200 cc crystalloid  TT:   75 minutes at 250 mmHg  Drains:   None  Closure:   Staples  Brief Clinical Note:   The patient is an 81 year old male who apparently fell backwards down some steps at his home on 06/16/15 and landed on his flexed elbow. He presented to the emergency room where x-rays demonstrated the above-noted injury. He was placed in a posterior splint and presents this time for definitive management of his injury.  Procedure:   The patient was brought into the operating room and lain in the supine position. After adequate general endotracheal intubation and anesthesia was obtained, the patient was repositioned in the right lateral decubitus position and secured using a beanbag. Care was taken to be sure that all of the bony prominences were well padded and that an axillary roll was used. The left upper extremity was prepped with ChloraPrep solution before being draped sterilely. Preoperative antibiotics were administered. After performing a surgical timeout to be sure that the appropriate surgical site was being addressed, the limb was exsanguinated with an Esmarch and the sterile tourniquet inflated to 250 mmHg. An approximately 10-12 cm curvilinear incision was made over the posterior aspect of the elbow and carried down through the subcutaneous tissues to enter the bursa. Abundant fracture hematoma was debrided using pickups, Metzenbaum scissors, curettes, and rongeurs. The joint was flushed with sterile saline before the fracture was gently reduced and temporary secured using  bone-holding tenaculums. The adequacy of reduction was verified fluoroscopically in AP and lateral projections and found to be satisfactory. The small precontoured Biomet posterior olecranon plate was selected. It was secured to the proximal fragment using 2 locking screws. Distally, a nonlocking bicortical screw was placed in the slotted screw hole and used to compress the fracture. 3 additional locking screws were placed distally and the locking "home run screw" was placed proximally. Given the patient's age and questionable bone quality, it was felt best to augment this repair with two #2 FiberWire sutures which were placed through the distal triceps tendon at the tip of the olecranon and passed in a figure-of-eight fashion over the plate and through a medial to laterally placed drill hole in the bone just distal to the most proximal screw. With the elbow in some extension, each of the 2 sutures were tied securely to reinforce fracture fixation. The adequacy of fracture reduction was verified fluoroscopically in AP and lateral projections and found to be satisfactory. One of the more distal screws was removed and replaced with a shorter locking screw. The fracture appeared to be stable as the elbow was placed through a range of motion from 0-90.  The wound was copiously irrigated with sterile saline solution before the deeper subcutaneous cutaneous tissues reapproximated using 2-0 Vicryl interrupted sutures. The more superficial subcutaneous cutaneous tissues also were closed using 2-0 Vicryl interrupted sutures before the skin was closed using staples. A total of 20 cc of 0.5% plain Sensorcaine was injected in and around the incision help with postoperative  analgesia before a sterile bulky dressing was applied to the elbow. The patient was placed into a posterior splint with the elbow at 90 of flexion before the arm was placed into a sling. The patient was then rolled back into the supine position on his  stretcher before he was extubated and returned to the recovery room in satisfactory condition after tolerating the procedure well.

## 2015-06-22 NOTE — Anesthesia Postprocedure Evaluation (Signed)
Anesthesia Post Note  Patient: Johnathan Foster  Procedure(s) Performed: Procedure(s) (LRB): OPEN REDUCTION INTERNAL FIXATION (ORIF) ELBOW/OLECRANON FRACTURE (Left)  Patient location during evaluation: PACU Anesthesia Type: General Level of consciousness: awake and alert Pain management: pain level controlled Vital Signs Assessment: post-procedure vital signs reviewed and stable Respiratory status: spontaneous breathing, nonlabored ventilation, respiratory function stable and patient connected to nasal cannula oxygen Cardiovascular status: blood pressure returned to baseline and stable Postop Assessment: no signs of nausea or vomiting Anesthetic complications: no    Last Vitals:  Filed Vitals:   06/22/15 1817 06/22/15 1849  BP: 160/102 142/91  Pulse: 92 94  Temp:  36.1 C  Resp: 10 18    Last Pain:  Filed Vitals:   06/22/15 1852  PainSc: 3                  Cleda Mccreedy Char Feltman

## 2015-06-22 NOTE — Anesthesia Procedure Notes (Signed)
Procedure Name: Intubation Date/Time: 06/22/2015 3:35 PM Performed by: Junious Silk Pre-anesthesia Checklist: Patient identified, Patient being monitored, Timeout performed, Emergency Drugs available and Suction available Patient Re-evaluated:Patient Re-evaluated prior to inductionOxygen Delivery Method: Circle system utilized Preoxygenation: Pre-oxygenation with 100% oxygen Intubation Type: IV induction Ventilation: Mask ventilation without difficulty Laryngoscope Size: Mac, 3 and McGraph Grade View: Grade I Tube type: Oral Tube size: 7.5 mm Number of attempts: 1 Airway Equipment and Method: Stylet and Video-laryngoscopy Placement Confirmation: ETT inserted through vocal cords under direct vision,  positive ETCO2 and breath sounds checked- equal and bilateral Secured at: 23 cm Tube secured with: Tape Dental Injury: Teeth and Oropharynx as per pre-operative assessment  Difficulty Due To: Difficult Airway- due to reduced neck mobility Comments: McGrath used for intubation due to limited neck mobility.  Attempts X 1

## 2015-06-22 NOTE — Progress Notes (Signed)
Pt. Unsteady and confused post surgery , spoke with Dr. Joice Lofts about pt. Status , family concerned about safety of taking pt. Home tonight , order received for observation stay tonight , pt. Transported to room 141 , patients daughter with him.

## 2015-06-22 NOTE — Progress Notes (Signed)
Spoke with Irving Burton regarding patients BP elevated. Suggested that she call Doc to treat BP.

## 2015-06-22 NOTE — Transfer of Care (Signed)
Immediate Anesthesia Transfer of Care Note  Patient: Johnathan Foster  Procedure(s) Performed: Procedure(s): OPEN REDUCTION INTERNAL FIXATION (ORIF) ELBOW/OLECRANON FRACTURE (Left)  Patient Location: PACU  Anesthesia Type:General  Level of Consciousness: awake, alert  and oriented  Airway & Oxygen Therapy: Patient Spontanous Breathing and Patient connected to nasal cannula oxygen  Post-op Assessment: Report given to RN and Post -op Vital signs reviewed and stable  Post vital signs: Reviewed and stable  Last Vitals:  Filed Vitals:   06/22/15 1445  BP: 155/109  Pulse: 105  Resp: 20    Complications: No apparent anesthesia complications

## 2015-06-22 NOTE — Progress Notes (Signed)
Patient arrived to the unit from PACU at 2000. Patient alert to self. Family at bedside. IV fluids infusing. Skin CDI. Ice pack applied and ext elevated.

## 2015-06-22 NOTE — Discharge Instructions (Addendum)
Keep splint dry and intact. Keep hand elevated above heart level. Apply ice to affected area frequently.Return for follow-up in 10-14 days or as scheduled       .AMBULATORY SURGERY  DISCHARGE INSTRUCTIONS   1) The drugs that you were given will stay in your system until tomorrow so for the next 24 hours you should not:  A) Drive an automobile B) Make any legal decisions C) Drink any alcoholic beverage   2) You may resume regular meals tomorrow.  Today it is better to start with liquids and gradually work up to solid foods.  You may eat anything you prefer, but it is better to start with liquids, then soup and crackers, and gradually work up to solid foods.   3) Please notify your doctor immediately if you have any unusual bleeding, trouble breathing, redness and pain at the surgery site, drainage, fever, or pain not relieved by medication. 4)   5) Your post-operative visit with Dr.                                     is: Date:                        Time:    Please call to schedule your post-operative visit.  6) Additional Instructions:

## 2015-06-22 NOTE — Anesthesia Preprocedure Evaluation (Addendum)
Anesthesia Evaluation  Patient identified by MRN, date of birth, ID band Patient awake    Reviewed: Allergy & Precautions, H&P , NPO status , Patient's Chart, lab work & pertinent test results, reviewed documented beta blocker date and time   History of Anesthesia Complications Negative for: history of anesthetic complications  Airway Mallampati: III  TM Distance: >3 FB Neck ROM: limited    Dental  (+) Chipped, Poor Dentition, Missing, Upper Dentures, Lower Dentures   Pulmonary neg shortness of breath, former smoker,    Pulmonary exam normal breath sounds clear to auscultation       Cardiovascular Exercise Tolerance: Good (-) angina(-) Past MI and (-) DOE negative cardio ROS Normal cardiovascular exam Rhythm:regular Rate:Normal     Neuro/Psych PSYCHIATRIC DISORDERS negative neurological ROS     GI/Hepatic negative GI ROS, Neg liver ROS,   Endo/Other  negative endocrine ROS  Renal/GU negative Renal ROS  negative genitourinary   Musculoskeletal   Abdominal   Peds  Hematology negative hematology ROS (+)   Anesthesia Other Findings Past Medical History:   BPH (benign prostatic hyperplasia)                           Memory loss                                                  Memory deficits                                 12/24/2014    Abnormality of gait                             12/24/2014    Alzheimer's dementia                                        Past Surgical History:   BONY PELVIS SURGERY                                          BMI    Body Mass Index   24.96 kg/m 2      Reproductive/Obstetrics negative OB ROS                            Anesthesia Physical Anesthesia Plan  ASA: III  Anesthesia Plan: General ETT   Post-op Pain Management:    Induction: Intravenous  Airway Management Planned: Oral ETT  Additional Equipment:   Intra-op Plan:   Post-operative Plan:    Informed Consent: I have reviewed the patients History and Physical, chart, labs and discussed the procedure including the risks, benefits and alternatives for the proposed anesthesia with the patient or authorized representative who has indicated his/her understanding and acceptance.   Dental Advisory Given  Plan Discussed with: CRNA  Anesthesia Plan Comments:        Anesthesia Quick Evaluation

## 2015-06-22 NOTE — Progress Notes (Signed)
Patient unable to stand for discharge in postop, confused due to hx of dementia,  , Dr Joice Lofts called. States admit for observation

## 2015-06-23 DIAGNOSIS — Z79899 Other long term (current) drug therapy: Secondary | ICD-10-CM | POA: Diagnosis not present

## 2015-06-23 DIAGNOSIS — Z9889 Other specified postprocedural states: Secondary | ICD-10-CM | POA: Diagnosis not present

## 2015-06-23 DIAGNOSIS — Z7982 Long term (current) use of aspirin: Secondary | ICD-10-CM | POA: Diagnosis not present

## 2015-06-23 DIAGNOSIS — R413 Other amnesia: Secondary | ICD-10-CM | POA: Diagnosis not present

## 2015-06-23 DIAGNOSIS — G309 Alzheimer's disease, unspecified: Secondary | ICD-10-CM | POA: Diagnosis not present

## 2015-06-23 DIAGNOSIS — N400 Enlarged prostate without lower urinary tract symptoms: Secondary | ICD-10-CM | POA: Diagnosis not present

## 2015-06-23 DIAGNOSIS — S52022A Displaced fracture of olecranon process without intraarticular extension of left ulna, initial encounter for closed fracture: Secondary | ICD-10-CM | POA: Diagnosis not present

## 2015-06-23 DIAGNOSIS — R269 Unspecified abnormalities of gait and mobility: Secondary | ICD-10-CM | POA: Diagnosis not present

## 2015-06-23 NOTE — Discharge Summary (Signed)
Physician Discharge Summary  Patient ID: Johnathan Foster MRN: [id number] DOB/AGE: [birth year] 82 y.o.  Admit date: 06/22/2015 Discharge date: 06/23/2015  Admission Diagnoses:  ELBOW FRACTURE Closed displaced olecranon fracture, left elbow.  Discharge Diagnoses: Patient Active Problem List   Diagnosis Date Noted  . Surgery, elective 06/22/2015  . Memory deficits 12/24/2014  . Abnormality of gait 12/24/2014  Closed displaced olecranon fracture, left elbow.  Past Medical History  Diagnosis Date  . BPH (benign prostatic hyperplasia)   . Memory loss   . Memory deficits 12/24/2014  . Abnormality of gait 12/24/2014  . Alzheimer's dementia      Transfusion: None   Consultants (if any):    Discharged Condition: Improved  Hospital Course: Johnathan Foster is an 82 y.o. male who was admitted 06/22/2015 with a diagnosis of a left elbow closed displaced olecranon fracture and went to the operating room on 06/22/2015 and underwent the above named procedures.    Surgeries: Procedure(s): OPEN REDUCTION INTERNAL FIXATION (ORIF) ELBOW/OLECRANON FRACTURE on 06/22/2015 Patient tolerated the surgery well. Taken to PACU where she was stabilized and then transferred to the orthopedic floor.  Foot pumps applied bilaterally at 80 mm. No evidence of DVT. Negative Homan. Patient IV was removed on POD1.  Patient underwent a outpatient surgery on 06/22/15.  Following surgery he was unsteady on his feet and he had a deterioration of his mental status.  Family did not feel comfortable with discharge following the procedure so he was admitted overnight for observation.  He reports that he is doing much better this AM, however no family present in the room to provider a more accurate history due to dementia.  Implants: Two #2 FiberWire Sutures  He was given perioperative antibiotics:      Anti-infectives    Start     Dose/Rate Route Frequency Ordered Stop   06/22/15 1501  ceFAZolin (ANCEF) 2-3 GM-% IVPB  SOLR    Comments:  Slemenda, Debbie: cabinet override      06/22/15 1501 06/22/15 1547   06/22/15 1500  ceFAZolin (ANCEF) IVPB 2 g/50 mL premix  Status:  Discontinued     2 g 100 mL/hr over 30 Minutes Intravenous  Once 06/22/15 1452 06/22/15 2017      He benefited maximally from the hospital stay and there were no complications.    Recent vital signs:  Filed Vitals:   06/22/15 2149 06/23/15 0357  BP: 155/94 111/75  Pulse: 99 102  Temp: 96.2 F (35.7 C) 97.7 F (36.5 C)  Resp: 18 18    Recent laboratory studies:  No results found for: HGB No results found for: WBC, PLT No results found for: INR No results found for: NA, K, CL, CO2, BUN, CREATININE, GLUCOSE  Discharge Medications:     Medication List    TAKE these medications        aspirin 325 MG tablet  Take 325 mg by mouth as needed. Reported on 06/22/2015     Calcium-Vitamin D 600-125 MG-UNIT Tabs  Take 1 tablet by mouth daily.     HYDROcodone-acetaminophen 5-325 MG tablet  Commonly known as:  NORCO  Take 1-2 tablets by mouth every 6 (six) hours as needed for moderate pain. MAXIMUM TOTAL ACETAMINOPHEN DOSE IS 4000 MG PER DAY     tamsulosin 0.4 MG Caps capsule  Commonly known as:  FLOMAX  Take 0.4 mg by mouth daily.     zolpidem 5 MG tablet  Commonly known as:  AMBIEN  Take 5 mg by  mouth at bedtime as needed for sleep.        Diagnostic Studies: Dg Elbow 2 Views Left  06/22/2015  CLINICAL DATA:  Intraoperative views for fixation of a left olecranon fracture suffered during a fall. Initial encounter. EXAM: LEFT ELBOW - 2 VIEW; DG C-ARM 61-120 MIN COMPARISON:  Plain films of the left elbow 06/16/2015. FINDINGS: We are provided with a single intraoperative fluoroscopic spot view of the left elbow. Image demonstrates plate and screws fixing an olecranon fracture. Position and alignment appear near anatomic. No acute abnormality. IMPRESSION: Status post fixation of a left olecranon fracture. No acute abnormality.  Electronically Signed   By: Drusilla Kanner M.D.   On: 06/22/2015 17:28   Dg Elbow Complete Left  06/16/2015  CLINICAL DATA:  Fall with left elbow injury and swelling. EXAM: LEFT ELBOW - COMPLETE 3+ VIEW COMPARISON:  None. FINDINGS: Comminuted fracture of the proximal ulna involves the olecranon with multiple displaced fracture fragments identified. The dominant olecranon fragment is displaced by at least 2-2.5 cm. No dislocation. Diffuse soft tissue swelling is seen overlying the fracture. There is evidence of a joint effusion. No other fracture identified. IMPRESSION: Comminuted and significantly displaced olecranon fracture of the proximal ulna. Electronically Signed   By: Irish Lack M.D.   On: 06/16/2015 19:25   Dg C-arm 1-60 Min  06/22/2015  CLINICAL DATA:  Intraoperative views for fixation of a left olecranon fracture suffered during a fall. Initial encounter. EXAM: LEFT ELBOW - 2 VIEW; DG C-ARM 61-120 MIN COMPARISON:  Plain films of the left elbow 06/16/2015. FINDINGS: We are provided with a single intraoperative fluoroscopic spot view of the left elbow. Image demonstrates plate and screws fixing an olecranon fracture. Position and alignment appear near anatomic. No acute abnormality. IMPRESSION: Status post fixation of a left olecranon fracture. No acute abnormality. Electronically Signed   By: Drusilla Kanner M.D.   On: 06/22/2015 17:28    Disposition: Pt is stable and ready for discharge home with his daughters.   Follow-up Information    Follow up with Christena Flake, MD.   Specialty:  Surgery   Why:  Call for follow-up appointment   Contact information:   [address] [facility] [place] [address] [phone]      Signed: Meriel Pica PA-C 06/23/2015, 8:00 AM

## 2015-06-23 NOTE — Progress Notes (Signed)
Subjective: 1 Day Post-Op Procedure(s) (LRB): OPEN REDUCTION INTERNAL FIXATION (ORIF) ELBOW/OLECRANON FRACTURE (Left) Patient reports pain as mild in the left elbow..   Patient is well, but patient is pleasantly demented. Plan is to go Home with daughters after hospital stay. Negative for chest pain and shortness of breath Fever: no Gastrointestinal:Negative for nausea and vomiting  Objective: Vital signs in last 24 hours: Temp:  [96 F (35.6 C)-97.9 F (36.6 C)] 97.7 F (36.5 C) (01/25 0357) Pulse Rate:  [90-105] 102 (01/25 0357) Resp:  [10-20] 18 (01/25 0357) BP: (111-164)/(75-109) 111/75 mmHg (01/25 0357) SpO2:  [95 %-100 %] 99 % (01/25 0357) Weight:  [68.04 kg (150 lb)] 68.04 kg (150 lb) (01/24 1445)  Intake/Output from previous day:  Intake/Output Summary (Last 24 hours) at 06/23/15 0749 Last data filed at 06/23/15 0647  Gross per 24 hour  Intake   1320 ml  Output    650 ml  Net    670 ml    Intake/Output this shift:    Labs: No results for input(s): HGB in the last 72 hours. No results for input(s): WBC, RBC, HCT, PLT in the last 72 hours. No results for input(s): NA, K, CL, CO2, BUN, CREATININE, GLUCOSE, CALCIUM in the last 72 hours. No results for input(s): LABPT, INR in the last 72 hours.   EXAM General - Patient is Alert and confused, but is able to answer questions about his condition and respond appropriately Extremity - ABD soft Sensation intact distally Dorsiflexion/Plantar flexion intact Incision: dressing C/D/I Dressing/Incision - No drainage noted around the left arm posterior splint. Motor Function - intact, moving foot and toes well on exam.   Abdomen is soft with normal BS.  Past Medical History  Diagnosis Date  . BPH (benign prostatic hyperplasia)   . Memory loss   . Memory deficits 12/24/2014  . Abnormality of gait 12/24/2014  . Alzheimer's dementia     Assessment/Plan: 1 Day Post-Op Procedure(s) (LRB): OPEN REDUCTION INTERNAL  FIXATION (ORIF) ELBOW/OLECRANON FRACTURE (Left) Active Problems:   Surgery, elective  Estimated body mass index is 24.96 kg/(m^2) as calculated from the following:   Height as of this encounter:  (1.651 m).   Weight as of this encounter: 68.04 kg (150 lb). Advance diet   Surgery initially intended for outpatient however following the operation the patient became dizzy and he had further deterioration of his mental status.  Pt reports no pain today and is able to answer questions appropriately, unsure of his previous mental status.  Pt does have to be reminded that he underwent surgery yesterday. Plan to discharge home today with his daughters this afternoon.  DVT Prophylaxis - Foot Pumps Non-weight bearing to the left upper extremity.  Valeria Batman, PA-C [facility] Orthopa[facility] Surgery 06/23/2015, 7:49 AM

## 2015-06-25 ENCOUNTER — Encounter: Payer: Self-pay | Admitting: Surgery

## 2015-06-28 ENCOUNTER — Ambulatory Visit (INDEPENDENT_AMBULATORY_CARE_PROVIDER_SITE_OTHER): Payer: Medicare Other | Admitting: Adult Health

## 2015-06-28 ENCOUNTER — Encounter: Payer: Self-pay | Admitting: Adult Health

## 2015-06-28 VITALS — BP 123/85 | HR 92 | Ht 65.0 in | Wt 149.0 lb

## 2015-06-28 DIAGNOSIS — R413 Other amnesia: Secondary | ICD-10-CM | POA: Diagnosis not present

## 2015-06-28 MED ORDER — DONEPEZIL HCL 5 MG PO TABS: 5.0000 mg | ORAL_TABLET | Freq: Every day | ORAL | Status: DC

## 2015-06-28 NOTE — Patient Instructions (Signed)
Begin Arciept for memory. If your symptoms worsen or you develop new symptoms please let us know.   Donepezil tablets What is this medicine? DONEPEZIL (doe NEP e zil) is used to treat mild to moderate dementia caused by Alzheimer's disease. This medicine may be used for other purposes; ask your health care provider or pharmacist if you have questions. What should I tell my health care provider before I take this medicine? They need to know if you have any of these conditions: -asthma or other lung disease -difficulty passing urine -head injury -heart disease -history of irregular heartbeat -liver disease -seizures (convulsions) -stomach or intestinal disease, ulcers or stomach bleeding -an unusual or allergic reaction to donepezil, other medicines, foods, dyes, or preservatives -pregnant or trying to get pregnant -breast-feeding How should I use this medicine? Take this medicine by mouth with a glass of water. Follow the directions on the prescription label. You may take this medicine with or without food. Take this medicine at regular intervals. This medicine is usually taken before bedtime. Do not take it more often than directed. Continue to take your medicine even if you feel better. Do not stop taking except on your doctor's advice. If you are taking the 23 mg donepezil tablet, swallow it whole; do not cut, crush, or chew it. Talk to your pediatrician regarding the use of this medicine in children. Special care may be needed. Overdosage: If you think you have taken too much of this medicine contact a poison control center or emergency room at once. NOTE: This medicine is only for you. Do not share this medicine with others. What if I miss a dose? If you miss a dose, take it as soon as you can. If it is almost time for your next dose, take only that dose, do not take double or extra doses. What may interact with this medicine? Do not take this medicine with any of the following  medications: -certain medicines for fungal infections like itraconazole, fluconazole, posaconazole, and voriconazole -cisapride -dextromethorphan; quinidine -dofetilide -dronedarone -pimozide -quinidine -thioridazine -ziprasidone This medicine may also interact with the following medications: -antihistamines for allergy, cough and cold -atropine -bethanechol -carbamazepine -certain medicines for bladder problems like oxybutynin, tolterodine -certain medicines for Parkinson's disease like benztropine, trihexyphenidyl -certain medicines for stomach problems like dicyclomine, hyoscyamine -certain medicines for travel sickness like scopolamine -dexamethasone -ipratropium -NSAIDs, medicines for pain and inflammation, like ibuprofen or naproxen -other medicines for Alzheimer's disease -other medicines that prolong the QT interval (cause an abnormal heart rhythm) -phenobarbital -phenytoin -rifampin, rifabutin or rifapentine This list may not describe all possible interactions. Give your health care provider a list of all the medicines, herbs, non-prescription drugs, or dietary supplements you use. Also tell them if you smoke, drink alcohol, or use illegal drugs. Some items may interact with your medicine. What should I watch for while using this medicine? Visit your doctor or health care professional for regular checks on your progress. Check with your doctor or health care professional if your symptoms do not get better or if they get worse. You may get drowsy or dizzy. Do not drive, use machinery, or do anything that needs mental alertness until you know how this drug affects you. What side effects may I notice from receiving this medicine? Side effects that you should report to your doctor or health care professional as soon as possible: -allergic reactions like skin rash, itching or hives, swelling of the face, lips, or tongue -changes in vision -feeling faint or lightheaded,  falls -problems with balance -redness, blistering, peeling or loosening of the skin, including inside the mouth -slow heartbeat, or palpitations -stomach pain -unusual bleeding or bruising, red or purple spots on the skin -vomiting -weight loss Side effects that usually do not require medical attention (report to your doctor or health care professional if they continue or are bothersome): -diarrhea, especially when starting treatment -headache -indigestion or heartburn -loss of appetite -muscle cramps -nausea This list may not describe all possible side effects. Call your doctor for medical advice about side effects. You may report side effects to FDA at 1-800-FDA-1088. Where should I keep my medicine? Keep out of reach of children. Store at room temperature between 15 and 30 degrees C (59 and 86 degrees F). Throw away any unused medicine after the expiration date. NOTE: This sheet is a summary. It may not cover all possible information. If you have questions about this medicine, talk to your doctor, pharmacist, or health care provider.    2016, Elsevier/Gold Standard. (2013-12-25 07:51:52)

## 2015-06-28 NOTE — Progress Notes (Signed)
I have read the note, and I agree with the clinical assessment and plan.  Aylen Rambert KEITH   

## 2015-06-28 NOTE — Progress Notes (Signed)
PATIENT: Johnathan Foster DOB: 10-Dec-1924  REASON FOR VISIT: follow up-  Memory HISTORY FROM: patient  HISTORY OF PRESENT ILLNESS: Johnathan Foster is an 82 -year-old male with a history of memory disorder. He returns today for follow-up. He is not on any medication for his memory. [name] states that he continues to have trouble with his memory.  [name] currently lives with him. He states that he is typically able to complete all ADLs independently. However most recently has surgery on his elbow and has required some assistance with bathing and dressing. The patient does not operate a motor vehicle. Patient never completed the MRI of the brain. The patient states that he does not like having MRIs. He denies any new neurological symptoms. He returns today for an evaluation.  HISTORY 12/24/14: Johnathan Foster is an 82 year old right-handed white male with a history of a progressive memory disorder over the last 6-12 months prior to this evaluation. The patient lives with his son, and his daughter lives next door. He has had increasing issues with short-term memory. 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 The patient has had difficulty with knowing where his house is when he leaves his daughter's house even know he lives next door. He requires assistance to manage his finances, and his daughter has had to help him with medications and appointments recently. The patient has had some episodes of dizziness, with a tendency to veer while walking. The patient has had blood work that reveals a normal B12 level. He feels somewhat weak and fatigued. The patient denies any issues controlling the bowels or the bladder. He denies any focal numbness or weakness of the face, arms, or legs. He is sent to this office for an evaluation.   REVIEW OF SYSTEMS:  Out of a complete 14 system review of symptoms, the patient complains only of the following symptoms, and all other reviewed systems are negative.   excessive thirst, constipation, insomnia, frequent waking, runny nose, trouble swallowing, frequency of urination, walking difficulty, confusion, dizziness  ALLERGIES: No Known Allergies  HOME MEDICATIONS: Outpatient Prescriptions Prior to Visit  Medication Sig Dispense Refill  . aspirin 325 MG tablet Take 325 mg by mouth as needed. Reported on 06/22/2015    . Calcium Carbonate-Vitamin D (CALCIUM-VITAMIN D) 600-125 MG-UNIT TABS Take 1 tablet by mouth daily.    Marland Kitchen HYDROcodone-acetaminophen (NORCO) 5-325 MG tablet Take 1-2 tablets by mouth every 6 (six) hours as needed for moderate pain. MAXIMUM TOTAL ACETAMINOPHEN DOSE IS 4000 MG PER DAY 30 tablet 0  . tamsulosin (FLOMAX) 0.4 MG CAPS capsule Take 0.4 mg by mouth daily.    Marland Kitchen zolpidem (AMBIEN) 5 MG tablet Take 5 mg by mouth at bedtime as needed for sleep.     No facility-administered medications prior to visit.    PAST MEDICAL HISTORY: Past Medical History  Diagnosis Date  . BPH (benign prostatic hyperplasia)   . Memory loss   . Memory deficits 12/24/2014  . Abnormality of gait 12/24/2014  . Alzheimer's dementia     PAST SURGICAL HISTORY: Past Surgical History  Procedure Laterality Date  . Bony pelvis surgery    . Orif elbow fracture Left 06/22/2015    Procedure: OPEN REDUCTION INTERNAL FIXATION (ORIF) ELBOW/OLECRANON FRACTURE;  Surgeon: Christena Flake, MD;  Location: [facility] ORS;  Service:  Orthopedics;  Laterality: Left;    FAMILY HISTORY: Family History  Problem Relation Age of Onset  . Cancer Sister   . Cancer Sister     SOCIAL HISTORY: Social History   Social History  . Marital Status: Widowed    Spouse Name: N/A  . Number of Children: 4  . Years of Education: N/A   Occupational History  . Not on file.   Social History Main Topics  . Smoking status: Former Smoker -- 1.00  packs/day for 20 years    Types: Cigarettes    Quit date: 05/29/1956  . Smokeless tobacco: Not on file  . Alcohol Use: Not on file  . Drug Use: Not on file  . Sexual Activity: Not on file   Other Topics Concern  . Not on file   Social History Narrative   Patient lives at home with his son.   Caffeine Use: none      PHYSICAL EXAM  Filed Vitals:   06/28/15 0916  Height:  (1.651 m)   There is no weight on file to calculate BMI.  MMSE - Mini Mental State Exam 06/28/2015  Orientation to time 0  Orientation to Place 3  Registration 3  Attention/ Calculation 2  Recall 1  Language- name 2 objects 2  Language- repeat 0  Language- follow 3 step command 3  Language- read & follow direction 0  Write a sentence 1  Copy design 0  Total score 15     Generalized: Well developed, in no acute distress   Neurological examination  Mentation: Alert. Follows all commands speech and language fluent Cranial nerve II-XII: Pupils were equal round reactive to light. Extraocular movements were full, visual field were full on confrontational test. Facial sensation and strength were normal. Uvula tongue midline. Head turning and shoulder shrug  were normal and symmetric. Motor: The motor testing reveals 5 over 5 strength of all 4 extremities-  Difficulty with testing the left upper extremity due to cast from surgery.   Left hand discolored and slightly swollen. Sensory: Sensory testing is intact to soft touch on all 4 extremities. No evidence of extinction is noted.  Coordination: Cerebellar testing reveals good finger-nose-finger and heel-to-shin bilaterally.  Gait and station:  Patient's gait is slightly unsteady. He has a slightly wide-based gait. Tandem gait not attempted. Reflexes: Deep tendon reflexes are symmetric and normal bilaterally.   DIAGNOSTIC DATA (LABS, IMAGING, TESTING) - I reviewed patient records, labs, notes, testing and imaging myself where available.   ASSESSMENT  AND PLAN 82 y.o. year old male  has a past medical history of BPH (benign prostatic hyperplasia); Memory loss; Memory deficits (12/24/2014); Abnormality of gait (12/24/2014); and Alzheimer's dementia. here with:   1. Memory disorder   [name] MMSE has slightly declined since the last visit. Today his score is 15/30 was previously 18/30. I have recommended that the patient start on Aricept. I have reviewed the side effects with the patient and his family. They are amenable to starting this medication. The patient should also follow-up with his surgeon. His left hand is discolored and slightly swollen. Patient advised that if his symptoms worsen or he develops new symptoms he should let us know. He will follow-up in 4 months or sooner if needed.     Butch Penny, MSN, NP-C 06/28/2015, 9:16 AM [facility] [address] [place], [place] [address] [phone]

## 2015-07-02 DIAGNOSIS — Z967 Presence of other bone and tendon implants: Secondary | ICD-10-CM | POA: Diagnosis not present

## 2015-07-02 DIAGNOSIS — Z8781 Personal history of (healed) traumatic fracture: Secondary | ICD-10-CM | POA: Diagnosis not present

## 2015-07-02 DIAGNOSIS — S52032D Displaced fracture of olecranon process with intraarticular extension of left ulna, subsequent encounter for closed fracture with routine healing: Secondary | ICD-10-CM | POA: Diagnosis not present

## 2015-07-14 ENCOUNTER — Encounter: Payer: Self-pay | Admitting: *Deleted

## 2015-07-15 ENCOUNTER — Ambulatory Visit: Payer: Self-pay | Admitting: Urology

## 2015-07-16 ENCOUNTER — Encounter: Payer: Self-pay | Admitting: Urology

## 2015-07-16 ENCOUNTER — Ambulatory Visit (INDEPENDENT_AMBULATORY_CARE_PROVIDER_SITE_OTHER): Payer: Medicare Other | Admitting: Urology

## 2015-07-16 VITALS — BP 133/87 | HR 112 | Ht 67.0 in | Wt 137.5 lb

## 2015-07-16 DIAGNOSIS — N401 Enlarged prostate with lower urinary tract symptoms: Secondary | ICD-10-CM | POA: Diagnosis not present

## 2015-07-16 DIAGNOSIS — N138 Other obstructive and reflux uropathy: Secondary | ICD-10-CM | POA: Insufficient documentation

## 2015-07-16 LAB — BLADDER SCAN AMB NON-IMAGING: Scan Result: 13

## 2015-07-16 NOTE — Progress Notes (Signed)
07/16/2015 10:53 AM   Johnathan Foster 06/20/1924 [id number]  Referring provider: Ronal Fear, NP [address] [place], [place] [address]  Chief Complaint  Patient presents with  . Benign Prostatic Hypertrophy    1 year follow up    HPI: Patient is an 82 year old Caucasian male who presents today for an annual visit for his BPH with LUTS.  His Alzheimer's is becoming more severe, so he is a poor historian.  He does present with his granddaughter.  BPH WITH LUTS His IPSS score today is 8, which is moderate lower urinary tract symptomatology. He is mixed with his quality life due to his urinary symptoms. His PVR is 13 mL.  His major complaint today dribbling and a weak urinary stream.  He has had these symptoms for over the last year.  He denies any dysuria, hematuria or suprapubic pain.  He also denies any recent fevers, chills, nausea or vomiting.  He does not have a family history of PCa.      IPSS      07/16/15 1000       International Prostate Symptom Score   How often have you had the sensation of not emptying your bladder? Not at All     How often have you had to urinate less than every two hours? More than half the time     How often have you found you stopped and started again several times when you urinated? Not at All     How often have you found it difficult to postpone urination? Not at All     How often have you had a weak urinary stream? Not at All     How often have you had to strain to start urination? Not at All     How many times did you typically get up at night to urinate? 4 Times     Total IPSS Score 8     Quality of Life due to urinary symptoms   If you were to spend the rest of your life with your urinary condition just the way it is now how would you feel about that? Mixed        Score:  1-7 Mild 8-19 Moderate 20-35 Severe     PMH: Past Medical History  Diagnosis Date  . BPH (benign prostatic hyperplasia)   . Memory loss   . Memory  deficits 12/24/2014  . Abnormality of gait 12/24/2014  . Alzheimer's dementia   . Fatigue   . Lower urinary tract symptoms     Surgical History: Past Surgical History  Procedure Laterality Date  . Bony pelvis surgery    . Orif elbow fracture Left 06/22/2015    Procedure: OPEN REDUCTION INTERNAL FIXATION (ORIF) ELBOW/OLECRANON FRACTURE;  Surgeon: Christena Flake, MD;  Location: [facility] ORS;  Service: Orthopedics;  Laterality: Left;  . Foot surgery      Home Medications:    Medication List       This list is accurate as of: 07/16/15 10:53 AM.  Always use your most recent med list.               aspirin 325 MG tablet  Take 325 mg by mouth as needed. Reported on 07/16/2015     Calcium Carb-Cholecalciferol [phone] MG-UNIT Caps  Take by mouth. Reported on 07/16/2015     Calcium-Vitamin D 600-125 MG-UNIT Tabs  Take 1 tablet by mouth daily.     donepezil 5 MG tablet  Commonly known as:  ARICEPT  Take 1 tablet (5 mg total) by mouth at bedtime.     HYDROcodone-acetaminophen 5-325 MG tablet  Commonly known as:  NORCO  Take 1-2 tablets by mouth every 6 (six) hours as needed for moderate pain. MAXIMUM TOTAL ACETAMINOPHEN DOSE IS 4000 MG PER DAY     MELATONIN ER PO  Take 1 tablet by mouth at bedtime.     risperiDONE 0.25 MG tablet  Commonly known as:  RISPERDAL  Take by mouth. Reported on 07/16/2015     zolpidem 5 MG tablet  Commonly known as:  AMBIEN  Take 5 mg by mouth at bedtime as needed for sleep. Reported on 07/16/2015        Allergies: No Known Allergies  Family History: Family History  Problem Relation Age of Onset  . Cancer Sister   . Cancer Sister   . Kidney disease Neg Hx   . Prostate cancer Neg Hx     Social History:  reports that he quit smoking about 59 years ago. His smoking use included Cigarettes. He has a 20 pack-year smoking history. He does not have any smokeless tobacco history on file. He reports that he does not drink alcohol or use illicit  drugs.  ROS: UROLOGY Frequent Urination?: Yes Hard to postpone urination?: No Burning/pain with urination?: No Get up at night to urinate?: Yes Leakage of urine?: No Urine stream starts and stops?: No Trouble starting stream?: No Do you have to strain to urinate?: No Blood in urine?: No Urinary tract infection?: No Sexually transmitted disease?: No Injury to kidneys or bladder?: No Painful intercourse?: No Weak stream?: No Erection problems?: No Penile pain?: No  Gastrointestinal Nausea?: No Vomiting?: No Indigestion/heartburn?: No Diarrhea?: No Constipation?: No  Constitutional Fever: No Night sweats?: No Weight loss?: No Fatigue?: No  Skin Skin rash/lesions?: No Itching?: No  Eyes Blurred vision?: No Double vision?: No  Ears/Nose/Throat Sore throat?: No Sinus problems?: No  Hematologic/Lymphatic Swollen glands?: No Easy bruising?: No  Cardiovascular Leg swelling?: No Chest pain?: No  Respiratory Cough?: No Shortness of breath?: No  Endocrine Excessive thirst?: No  Musculoskeletal Back pain?: No Joint pain?: No  Neurological Headaches?: No Dizziness?: No  Psychologic Depression?: No Anxiety?: No  Physical Exam: BP 133/87 mmHg  Pulse 112  Ht 5\' 7"  (1.702 m)  Wt 137 lb 8 oz (62.37 kg)  BMI 21.53 kg/m2  Constitutional: Well nourished. Alert and oriented, No acute distress. HEENT: [place] AT, moist mucus membranes. Trachea midline, no masses. Cardiovascular: No clubbing, cyanosis, or edema. Respiratory: Normal respiratory effort, no increased work of breathing. GI: Abdomen is soft, non tender, non distended, no abdominal masses. Liver and spleen not palpable.  No hernias appreciated.  Stool sample for occult testing is not indicated.   GU: No CVA tenderness.  No bladder fullness or masses.  Patient with circumcised phallus.  Urethral meatus is patent.  No penile discharge. No penile lesions or rashes. Scrotum without lesions, cysts, rashes  and/or edema.  Testicles are located scrotally bilaterally. No masses are appreciated in the testicles. Left and right epididymis are normal. Rectal: Patient with  normal sphincter tone. Anus and perineum without scarring or rashes. No rectal masses are appreciated. Prostate is approximately 60 grams, firm in the right lobe, no nodules are appreciated. Seminal vesicles are normal. Skin: No rashes, bruises or suspicious lesions. Lymph: No cervical or inguinal adenopathy. Neurologic: Grossly intact, no focal deficits, moving all 4 extremities. Psychiatric: Normal mood and affect.  Laboratory Data: PSA History  1.6 ng/mL on 09/24/2012   Pertinent Imaging: Results for EYDEN, DOBIE (MRN [id number]) as of 07/16/2015 10:51  Ref. Range 07/16/2015 10:44  Scan Result Unknown 13    Assessment & Plan:    1. BPH with LUTS:   I PSS score is 8/3. His PVR is 13 mL.  He is likely dehydrated.  I have encouraged the patient to drink more fluids.  He will RTC in one year for IPSS score and PVR.     Return in about 1 year (around 07/15/2016) for IPSS score and PVR.  These notes generated with voice recognition software. I apologize for typographical errors.  Michiel Cowboy, PA-C  [facility] [address], [address] [place], [place] [address] [phone]

## 2015-08-05 DIAGNOSIS — R42 Dizziness and giddiness: Secondary | ICD-10-CM | POA: Diagnosis not present

## 2015-08-05 DIAGNOSIS — R413 Other amnesia: Secondary | ICD-10-CM | POA: Diagnosis not present

## 2015-08-05 DIAGNOSIS — I4892 Unspecified atrial flutter: Secondary | ICD-10-CM | POA: Diagnosis not present

## 2015-08-05 DIAGNOSIS — G309 Alzheimer's disease, unspecified: Secondary | ICD-10-CM | POA: Diagnosis not present

## 2015-08-13 DIAGNOSIS — S52032D Displaced fracture of olecranon process with intraarticular extension of left ulna, subsequent encounter for closed fracture with routine healing: Secondary | ICD-10-CM | POA: Diagnosis not present

## 2015-10-26 ENCOUNTER — Ambulatory Visit (INDEPENDENT_AMBULATORY_CARE_PROVIDER_SITE_OTHER): Payer: Medicare Other | Admitting: Adult Health

## 2015-10-26 ENCOUNTER — Encounter: Payer: Self-pay | Admitting: Adult Health

## 2015-10-26 VITALS — BP 130/88 | HR 86 | Resp 16 | Ht 65.0 in | Wt 148.0 lb

## 2015-10-26 DIAGNOSIS — R413 Other amnesia: Secondary | ICD-10-CM | POA: Diagnosis not present

## 2015-10-26 DIAGNOSIS — H9193 Unspecified hearing loss, bilateral: Secondary | ICD-10-CM | POA: Diagnosis not present

## 2015-10-26 NOTE — Patient Instructions (Signed)
Memory score has improved Consider hearing aids Retry Aricept If your symptoms worsen or you develop new symptoms please let us know.

## 2015-10-26 NOTE — Progress Notes (Signed)
I have read the note, and I agree with the clinical assessment and plan.  Johnathan Foster   

## 2015-10-26 NOTE — Progress Notes (Signed)
PATIENT: Johnathan Foster DOB: 1925-02-22  REASON FOR VISIT: follow up- memory HISTORY FROM: patient  HISTORY OF PRESENT ILLNESS:  Johnathan Foster Is an 82 year old male with a history of memory disorder. He returns today for follow-up. At the last visit he was started on Aricept. He reports that he stopped taking the medication. He states that it was making him feel weird. He states that his brother also had memory problems and he believes his family was over medicating him. For that reason he decided to stop the medication. The patient lives with his son. He is able to complete all ADLs independently. He does not operate a motor vehicle. The patient does have hearing loss but has not been evaluated for hearing aids. The patient was also on Ambien but the son reports that he stopped this medication as well. Patient denies any trouble sleeping. He returns today for an evaluation.  HISTORY 06/28/15: Johnathan Foster is an 82 -year-old male with a history of memory disorder. He returns today for follow-up. He is not on any medication for his memory. [name] states that he continues to have trouble with his memory. [name] currently lives with him. He states that he is typically able to complete all ADLs independently. However most recently has surgery on his elbow and has required some assistance with bathing and dressing. The patient does not operate a motor vehicle. Patient never completed the MRI of the brain. The patient states that he does not like having MRIs. He denies any new neurological symptoms. He returns today for an evaluation.  HISTORY 12/24/14: Johnathan Foster is an 82 year old right-handed white male with a history of a progressive memory disorder over the last 6-12 months prior to this evaluation. The patient lives with his son, and his daughter lives next door. He has had increasing issues with short-term memory. He still operates a Librarian, academic, but he is having difficulty  with directions with driving, and he frequently will be unable to locate his car when he parks the car and goes into a store. The patient has had difficulty with knowing where his house is when he leaves his daughter's house even know he lives next door. He requires assistance to manage his finances, and his daughter has had to help him with medications and appointments recently. The patient has had some episodes of dizziness, with a tendency to veer while walking. The patient has had blood work that reveals a normal B12 level. He feels somewhat weak and fatigued. The patient denies any issues controlling the bowels or the bladder. He denies any focal numbness or weakness of the face, arms, or legs. He is sent to this office for an evaluation.   REVIEW OF SYSTEMS: Out of a complete 14 system review of symptoms, the patient complains only of the following symptoms, and all other reviewed systems are negative.  Cold intolerance, frequency of urination, insomnia, frequent waking, runny nose, dizziness, bruise/bleed easily  ALLERGIES: No Known Allergies  HOME MEDICATIONS: Outpatient Prescriptions Prior to Visit  Medication Sig Dispense Refill  . aspirin 325 MG tablet Take 325 mg by mouth as needed. Reported on 07/16/2015    . Calcium Carb-Cholecalciferol [phone] MG-UNIT CAPS Take by mouth. Reported on 07/16/2015    . Calcium Carbonate-Vitamin D (CALCIUM-VITAMIN D) 600-125 MG-UNIT TABS Take 1 tablet by mouth daily.    Marland Kitchen donepezil (ARICEPT) 5 MG tablet Take 1 tablet (5 mg total) by mouth at bedtime. 30 tablet 5  .  HYDROcodone-acetaminophen (NORCO) 5-325 MG tablet Take 1-2 tablets by mouth every 6 (six) hours as needed for moderate pain. MAXIMUM TOTAL ACETAMINOPHEN DOSE IS 4000 MG PER DAY (Patient not taking: Reported on 07/16/2015) 30 tablet 0  . MELATONIN ER PO Take 1 tablet by mouth at bedtime.    . risperiDONE (RISPERDAL) 0.25 MG tablet Take by mouth. Reported on 07/16/2015    . zolpidem (AMBIEN) 5  MG tablet Take 5 mg by mouth at bedtime as needed for sleep. Reported on 07/16/2015     No facility-administered medications prior to visit.    PAST MEDICAL HISTORY: Past Medical History  Diagnosis Date  . BPH (benign prostatic hyperplasia)   . Memory loss   . Memory deficits 12/24/2014  . Abnormality of gait 12/24/2014  . Alzheimer's dementia   . Fatigue   . Lower urinary tract symptoms     PAST SURGICAL HISTORY: Past Surgical History  Procedure Laterality Date  . Bony pelvis surgery    . Orif elbow fracture Left 06/22/2015    Procedure: OPEN REDUCTION INTERNAL FIXATION (ORIF) ELBOW/OLECRANON FRACTURE;  Surgeon: Christena Flake, MD;  Location: [facility] ORS;  Service: Orthopedics;  Laterality: Left;  . Foot surgery      FAMILY HISTORY: Family History  Problem Relation Age of Onset  . Cancer Sister   . Cancer Sister   . Kidney disease Neg Hx   . Prostate cancer Neg Hx     SOCIAL HISTORY: Social History   Social History  . Marital Status: Widowed    Spouse Name: N/A  . Number of Children: 4  . Years of Education: N/A   Occupational History  . Not on file.   Social History Main Topics  . Smoking status: Former Smoker -- 1.00 packs/day for 20 years    Types: Cigarettes    Quit date: 05/29/1956  . Smokeless tobacco: Not on file  . Alcohol Use: No  . Drug Use: No  . Sexual Activity: Not on file   Other Topics Concern  . Not on file   Social History Narrative   Patient lives at home with his son.   Caffeine Use: none      PHYSICAL EXAM  Filed Vitals:   10/26/15 0959  BP: 130/88  Pulse: 86  Resp: 16  Height:  (1.651 m)  Weight: 148 lb (67.132 kg)   Body mass index is 24.63 kg/(m^2).   MMSE - Mini Mental State Exam 10/26/2015 06/28/2015  Orientation to time 3 0  Orientation to Place 3 3  Registration 3 3  Attention/ Calculation 3 2  Recall 2 1  Language- name 2 objects 2 2  Language- repeat 1 0  Language- follow 3 step command 3 3  Language- read  & follow direction 1 0  Write a sentence 1 1  Copy design 0 0  Total score 22 15    Generalized: Well developed, in no acute distress    Neurological examination  Mentation: Alert. Follows all commands speech and language fluent. Hard of hearing. Cranial nerve II-XII: Pupils were equal round reactive to light. Extraocular movements were full, visual field were full on confrontational test. Facial sensation and strength were normal. Uvula tongue midline. Head turning and shoulder shrug  were normal and symmetric. Motor: The motor testing reveals 5 over 5 strength of all 4 extremities. Good symmetric motor tone is noted throughout.  Sensory: Sensory testing is intact to soft touch on all 4 extremities. No evidence of extinction is  noted.  Coordination: Cerebellar testing reveals good finger-nose-finger and heel-to-shin bilaterally.  Gait and station: Gait is normal.  Reflexes: Deep tendon reflexes are symmetric and normal bilaterally.   DIAGNOSTIC DATA (LABS, IMAGING, TESTING) - I reviewed patient records, labs, notes, testing and imaging myself where available.     ASSESSMENT AND PLAN 82 y.o. year old male  has a past medical history of BPH (benign prostatic hyperplasia); Memory loss; Memory deficits (12/24/2014); Abnormality of gait (12/24/2014); Alzheimer's dementia; Fatigue; and Lower urinary tract symptoms. here with:  1. Memory disturbance 2. Hearing loss  [name] memory score has improved. His MMSE today is 22/30 was previously 15/30. He will re-try Aricept. [name] states that he will get him a pillbox that he can administer his own medication. If the patient is unable to tolerate Aricept he will let me know. The patient also has  hearing loss that could be affecting his memory. I advised that he should be evaluated for possible hearing aids. He will follow-up in 6 months with Dr. Anne HahnWillis.     Butch PennyMegan Sunni Richardson, MSN, NP-C 10/26/2015, 9:44 AM [facility] [address], [address] [place], [place] [address] [phone]

## 2015-11-10 DIAGNOSIS — Z6824 Body mass index (BMI) 24.0-24.9, adult: Secondary | ICD-10-CM | POA: Diagnosis not present

## 2015-11-10 DIAGNOSIS — R269 Unspecified abnormalities of gait and mobility: Secondary | ICD-10-CM | POA: Diagnosis not present

## 2015-11-10 DIAGNOSIS — I4892 Unspecified atrial flutter: Secondary | ICD-10-CM | POA: Diagnosis not present

## 2015-11-10 DIAGNOSIS — G309 Alzheimer's disease, unspecified: Secondary | ICD-10-CM | POA: Diagnosis not present

## 2016-02-07 DIAGNOSIS — G309 Alzheimer's disease, unspecified: Secondary | ICD-10-CM | POA: Diagnosis not present

## 2016-02-07 DIAGNOSIS — R269 Unspecified abnormalities of gait and mobility: Secondary | ICD-10-CM | POA: Diagnosis not present

## 2016-02-07 DIAGNOSIS — R42 Dizziness and giddiness: Secondary | ICD-10-CM | POA: Diagnosis not present

## 2016-02-07 DIAGNOSIS — I4892 Unspecified atrial flutter: Secondary | ICD-10-CM | POA: Diagnosis not present

## 2016-03-27 ENCOUNTER — Inpatient Hospital Stay
Admission: EM | Admit: 2016-03-27 | Discharge: 2016-03-30 | DRG: 640 | Disposition: A | Discharge status: Home / Self Care | Payer: Medicare Other | Attending: Internal Medicine | Admitting: Internal Medicine

## 2016-03-27 ENCOUNTER — Encounter: Payer: Self-pay | Admitting: Emergency Medicine

## 2016-03-27 ENCOUNTER — Inpatient Hospital Stay: Payer: Medicare Other

## 2016-03-27 DIAGNOSIS — R059 Cough, unspecified: Secondary | ICD-10-CM

## 2016-03-27 DIAGNOSIS — Z87891 Personal history of nicotine dependence: Secondary | ICD-10-CM | POA: Diagnosis not present

## 2016-03-27 DIAGNOSIS — R112 Nausea with vomiting, unspecified: Secondary | ICD-10-CM | POA: Diagnosis not present

## 2016-03-27 DIAGNOSIS — R531 Weakness: Secondary | ICD-10-CM

## 2016-03-27 DIAGNOSIS — Z809 Family history of malignant neoplasm, unspecified: Secondary | ICD-10-CM

## 2016-03-27 DIAGNOSIS — G309 Alzheimer's disease, unspecified: Secondary | ICD-10-CM | POA: Diagnosis present

## 2016-03-27 DIAGNOSIS — R410 Disorientation, unspecified: Secondary | ICD-10-CM | POA: Diagnosis not present

## 2016-03-27 DIAGNOSIS — I248 Other forms of acute ischemic heart disease: Secondary | ICD-10-CM | POA: Diagnosis not present

## 2016-03-27 DIAGNOSIS — R1110 Vomiting, unspecified: Secondary | ICD-10-CM

## 2016-03-27 DIAGNOSIS — E871 Hypo-osmolality and hyponatremia: Principal | ICD-10-CM | POA: Diagnosis present

## 2016-03-27 DIAGNOSIS — R05 Cough: Secondary | ICD-10-CM | POA: Diagnosis not present

## 2016-03-27 DIAGNOSIS — E860 Dehydration: Secondary | ICD-10-CM | POA: Diagnosis not present

## 2016-03-27 DIAGNOSIS — J90 Pleural effusion, not elsewhere classified: Secondary | ICD-10-CM | POA: Diagnosis not present

## 2016-03-27 DIAGNOSIS — K5900 Constipation, unspecified: Secondary | ICD-10-CM | POA: Diagnosis present

## 2016-03-27 DIAGNOSIS — R55 Syncope and collapse: Secondary | ICD-10-CM | POA: Diagnosis present

## 2016-03-27 DIAGNOSIS — F0280 Dementia in other diseases classified elsewhere without behavioral disturbance: Secondary | ICD-10-CM | POA: Diagnosis not present

## 2016-03-27 DIAGNOSIS — N390 Urinary tract infection, site not specified: Secondary | ICD-10-CM | POA: Diagnosis not present

## 2016-03-27 DIAGNOSIS — R1310 Dysphagia, unspecified: Secondary | ICD-10-CM | POA: Diagnosis not present

## 2016-03-27 DIAGNOSIS — R931 Abnormal findings on diagnostic imaging of heart and coronary circulation: Secondary | ICD-10-CM | POA: Diagnosis not present

## 2016-03-27 DIAGNOSIS — Z23 Encounter for immunization: Secondary | ICD-10-CM | POA: Diagnosis present

## 2016-03-27 DIAGNOSIS — R918 Other nonspecific abnormal finding of lung field: Secondary | ICD-10-CM | POA: Diagnosis not present

## 2016-03-27 DIAGNOSIS — Z7982 Long term (current) use of aspirin: Secondary | ICD-10-CM

## 2016-03-27 DIAGNOSIS — I082 Rheumatic disorders of both aortic and tricuspid valves: Secondary | ICD-10-CM | POA: Diagnosis not present

## 2016-03-27 DIAGNOSIS — J189 Pneumonia, unspecified organism: Secondary | ICD-10-CM | POA: Diagnosis not present

## 2016-03-27 DIAGNOSIS — Z8249 Family history of ischemic heart disease and other diseases of the circulatory system: Secondary | ICD-10-CM

## 2016-03-27 DIAGNOSIS — I4892 Unspecified atrial flutter: Secondary | ICD-10-CM | POA: Diagnosis not present

## 2016-03-27 DIAGNOSIS — M6281 Muscle weakness (generalized): Secondary | ICD-10-CM

## 2016-03-27 DIAGNOSIS — N400 Enlarged prostate without lower urinary tract symptoms: Secondary | ICD-10-CM | POA: Diagnosis present

## 2016-03-27 DIAGNOSIS — J069 Acute upper respiratory infection, unspecified: Secondary | ICD-10-CM | POA: Diagnosis not present

## 2016-03-27 DIAGNOSIS — R42 Dizziness and giddiness: Secondary | ICD-10-CM | POA: Diagnosis not present

## 2016-03-27 LAB — URINALYSIS COMPLETE WITH MICROSCOPIC (ARMC ONLY)
Bilirubin Urine: NEGATIVE
Glucose, UA: NEGATIVE mg/dL
Hgb urine dipstick: NEGATIVE
KETONES UR: NEGATIVE mg/dL
Nitrite: NEGATIVE
PROTEIN: NEGATIVE mg/dL
SQUAMOUS EPITHELIAL / LPF: NONE SEEN
Specific Gravity, Urine: 1.003 — ABNORMAL LOW (ref 1.005–1.030)
pH: 7 (ref 5.0–8.0)

## 2016-03-27 LAB — BASIC METABOLIC PANEL
ANION GAP: 8 (ref 5–15)
BUN: 18 mg/dL (ref 6–20)
CHLORIDE: 95 mmol/L — AB (ref 101–111)
CO2: 25 mmol/L (ref 22–32)
Calcium: 8.6 mg/dL — ABNORMAL LOW (ref 8.9–10.3)
Creatinine, Ser: 0.95 mg/dL (ref 0.61–1.24)
GFR calc non Af Amer: >60 mL/min (ref >60)
GLUCOSE: 85 mg/dL (ref 65–99)
Potassium: 3.9 mmol/L (ref 3.5–5.1)
Sodium: 128 mmol/L — ABNORMAL LOW (ref 135–145)

## 2016-03-27 LAB — COMPREHENSIVE METABOLIC PANEL
ALBUMIN: 3.7 g/dL (ref 3.5–5.0)
ALT: 30 U/L (ref 17–63)
AST: 59 U/L — AB (ref 15–41)
Alkaline Phosphatase: 59 U/L (ref 38–126)
Anion gap: 9 (ref 5–15)
BUN: 17 mg/dL (ref 6–20)
CHLORIDE: 83 mmol/L — AB (ref 101–111)
CO2: 26 mmol/L (ref 22–32)
CREATININE: 1.02 mg/dL (ref 0.61–1.24)
Calcium: 8.7 mg/dL — ABNORMAL LOW (ref 8.9–10.3)
GFR calc non Af Amer: >60 mL/min (ref >60)
GLUCOSE: 92 mg/dL (ref 65–99)
Potassium: 3.6 mmol/L (ref 3.5–5.1)
SODIUM: 118 mmol/L — AB (ref 135–145)
Total Bilirubin: 0.9 mg/dL (ref 0.3–1.2)
Total Protein: 6.8 g/dL (ref 6.5–8.1)

## 2016-03-27 LAB — CBC
HCT: 39.4 % — ABNORMAL LOW (ref 40.0–52.0)
Hemoglobin: 13.8 g/dL (ref 13.0–18.0)
MCH: 34.8 pg — AB (ref 26.0–34.0)
MCHC: 35 g/dL (ref 32.0–36.0)
MCV: 99.5 fL (ref 80.0–100.0)
PLATELETS: 185 10*3/uL (ref 150–440)
RBC: 3.96 MIL/uL — AB (ref 4.40–5.90)
RDW: 13.5 % (ref 11.5–14.5)
WBC: 6.9 10*3/uL (ref 3.8–10.6)

## 2016-03-27 LAB — INFLUENZA PANEL BY PCR (TYPE A & B)
H1N1 flu by pcr: NOT DETECTED
INFLBPCR: NEGATIVE
Influenza A By PCR: NEGATIVE

## 2016-03-27 LAB — LIPASE, BLOOD: Lipase: 33 U/L (ref 11–51)

## 2016-03-27 LAB — TROPONIN I: TROPONIN I: 0.03 ng/mL — AB (ref <0.03)

## 2016-03-27 MED ORDER — ONDANSETRON HCL 4 MG/2ML IJ SOLN: 4.0000 mg | Freq: Four times a day (QID) | INTRAMUSCULAR | Status: DC | PRN

## 2016-03-27 MED ORDER — ONDANSETRON HCL 4 MG/2ML IJ SOLN
4.0000 mg | Freq: Once | INTRAMUSCULAR | Status: AC
  Administered 2016-03-27: 4 mg via INTRAVENOUS

## 2016-03-27 MED ORDER — ASPIRIN EC 81 MG PO TBEC
81.0000 mg | DELAYED_RELEASE_TABLET | Freq: Every day | ORAL | Status: DC
  Administered 2016-03-28 – 2016-03-30 (×3): 81 mg via ORAL
  Filled 2016-03-27 (×4): qty 1

## 2016-03-27 MED ORDER — CEFTRIAXONE SODIUM-DEXTROSE 1-3.74 GM-% IV SOLR
1.0000 g | Freq: Every day | INTRAVENOUS | Status: DC
  Administered 2016-03-28 (×2): 1 g via INTRAVENOUS
  Filled 2016-03-27 (×2): qty 50

## 2016-03-27 MED ORDER — ENSURE ENLIVE PO LIQD
237.0000 mL | Freq: Two times a day (BID) | ORAL | Status: DC
  Administered 2016-03-28 – 2016-03-30 (×6): 237 mL via ORAL

## 2016-03-27 MED ORDER — ONDANSETRON HCL 4 MG/2ML IJ SOLN
INTRAMUSCULAR | Status: AC
  Administered 2016-03-27: 4 mg via INTRAVENOUS
  Filled 2016-03-27: qty 2

## 2016-03-27 MED ORDER — AZITHROMYCIN 500 MG PO TABS
500.0000 mg | ORAL_TABLET | Freq: Every day | ORAL | Status: AC
Start: 2016-03-27 — End: 2016-03-27
  Administered 2016-03-27: 23:00:00 500 mg via ORAL
  Filled 2016-03-27: qty 1

## 2016-03-27 MED ORDER — MAGNESIUM SULFATE 2 GM/50ML IV SOLN
2.0000 g | Freq: Once | INTRAVENOUS | Status: AC
  Administered 2016-03-27: 2 g via INTRAVENOUS
  Filled 2016-03-27: qty 50

## 2016-03-27 MED ORDER — CALCIUM CARBONATE-VITAMIN D 500-200 MG-UNIT PO TABS
1.0000 | ORAL_TABLET | Freq: Every day | ORAL | Status: DC
  Administered 2016-03-28 – 2016-03-30 (×3): 1 via ORAL
  Filled 2016-03-27 (×3): qty 1

## 2016-03-27 MED ORDER — PNEUMOCOCCAL VAC POLYVALENT 25 MCG/0.5ML IJ INJ
0.5000 mL | INJECTION | INTRAMUSCULAR | Status: AC
  Administered 2016-03-29: 11:00:00 0.5 mL via INTRAMUSCULAR
  Filled 2016-03-27: qty 0.5

## 2016-03-27 MED ORDER — AZITHROMYCIN 250 MG PO TABS
250.0000 mg | ORAL_TABLET | Freq: Every day | ORAL | Status: DC
  Administered 2016-03-28: 250 mg via ORAL
  Filled 2016-03-27: qty 1

## 2016-03-27 MED ORDER — ENOXAPARIN SODIUM 40 MG/0.4ML ~~LOC~~ SOLN
40.0000 mg | SUBCUTANEOUS | Status: DC
  Administered 2016-03-27 – 2016-03-29 (×3): 40 mg via SUBCUTANEOUS
  Filled 2016-03-27 (×3): qty 0.4

## 2016-03-27 MED ORDER — SODIUM CHLORIDE 0.9 % IV BOLUS (SEPSIS)
1000.0000 mL | Freq: Once | INTRAVENOUS | Status: AC
  Administered 2016-03-27: 1000 mL via INTRAVENOUS

## 2016-03-27 MED ORDER — METOPROLOL TARTRATE 25 MG PO TABS
12.5000 mg | ORAL_TABLET | Freq: Two times a day (BID) | ORAL | Status: DC
  Administered 2016-03-27: 12.5 mg via ORAL
  Filled 2016-03-27: qty 1

## 2016-03-27 MED ORDER — SODIUM CHLORIDE 0.9 % IV SOLN
INTRAVENOUS | Status: DC
  Administered 2016-03-27 – 2016-03-29 (×3): via INTRAVENOUS

## 2016-03-27 MED ORDER — ACETAMINOPHEN 650 MG RE SUPP: 650.0000 mg | Freq: Four times a day (QID) | RECTAL | Status: DC | PRN

## 2016-03-27 MED ORDER — ACETAMINOPHEN 325 MG PO TABS: 650.0000 mg | ORAL_TABLET | Freq: Four times a day (QID) | ORAL | Status: DC | PRN

## 2016-03-27 MED ORDER — DEXTROSE 5 % IV SOLN: 1.0000 g | INTRAVENOUS | Status: DC

## 2016-03-27 NOTE — H&P (Signed)
[facility] [place] [facility]   PATIENT NAME: Johnathan Foster    MR#:  [id number]  DATE OF BIRTH:  Jul 22, 1924  DATE OF ADMISSION:  03/27/2016  PRIMARY CARE PHYSICIAN: Physician in [place]   REQUESTING/REFERRING PHYSICIAN: Dr Chaney Mallingavid Yao  CHIEF COMPLAINT:   Chief Complaint  Patient presents with  . Emesis  . Near Syncope    HISTORY OF PRESENT ILLNESS:  Johnathan RancherCarl Syracuse  is a 82 y.o. male presents with not feeling well. He has vomited since Saturday. No diarrhea now is voices course he feels like he has a cold. He has not been eating well in 2 days. He is dizzy when he gets up. The patient is not a good historian secondary to difficulty hearing my questions. In the ER he was found to have a sodium of 118 and hospitalist services contacted for further evaluation.  PAST MEDICAL HISTORY:   Past Medical History:  Diagnosis Date  . Abnormality of gait 12/24/2014  . Alzheimer's dementia   . BPH (benign prostatic hyperplasia)   . Fatigue   . Lower urinary tract symptoms   . Memory deficits 12/24/2014  . Memory loss     PAST SURGICAL HISTORY:   Past Surgical History:  Procedure Laterality Date  . BONY PELVIS SURGERY    . FOOT SURGERY    . ORIF ELBOW FRACTURE Left 06/22/2015   Procedure: OPEN REDUCTION INTERNAL FIXATION (ORIF) ELBOW/OLECRANON FRACTURE;  Surgeon: Christena FlakeJohn J Poggi, MD;  Location: [facility] ORS;  Service: Orthopedics;  Laterality: Left;    SOCIAL HISTORY:   Social History  Substance Use Topics  . Smoking status: Former Smoker    Packs/day: 1.00    Years: 20.00    Types: Cigarettes    Quit date: 05/29/1956  . Smokeless tobacco: Never Used  . Alcohol use No    FAMILY HISTORY:   Family History  Problem Relation Age of Onset  . CAD Father   . Cancer Sister   . Cancer Sister   . Kidney disease Neg Hx   . Prostate cancer Neg Hx     DRUG ALLERGIES:  No Known Allergies  REVIEW OF SYSTEMS:  CONSTITUTIONAL: No fever. Positive for  weakness. Positive for dizziness  EYES: No blurred or double vision.  EARS, NOSE, AND THROAT: No tinnitus or ear pain. Positive for sore throat. Decreased hearing area and positive for hoarse voice. RESPIRATORY: No cough, shortness of breath, wheezing or hemoptysis.  CARDIOVASCULAR: No chest pain, orthopnea, edema.  GASTROINTESTINAL: No nausea, vomiting, diarrhea or abdominal pain. No blood in bowel movements GENITOURINARY: No dysuria, hematuria.  ENDOCRINE: No polyuria, nocturia,  HEMATOLOGY: No anemia, easy bruising or bleeding SKIN: No rash or lesion. MUSCULOSKELETAL: No joint pain or arthritis.   NEUROLOGIC: No tingling, numbness, weakness.  PSYCHIATRY: No anxiety or depression.   MEDICATIONS AT HOME:   Prior to Admission medications   Medication Sig Start Date End Date Taking? Authorizing Provider  Calcium Carbonate-Vitamin D (CALCIUM-VITAMIN D) 600-125 MG-UNIT TABS Take 1 tablet by mouth daily.    Historical Provider, MD    Patient only taking calcium and vitamin D.  VITAL SIGNS:  Blood pressure (!) 165/109, pulse (!) 105, temperature 98.5 F (36.9 C), temperature source Oral, resp. rate 16, height 5\' 8"  (1.727 m), weight 68.9 kg (152 lb), SpO2 98 %.  PHYSICAL EXAMINATION:  GENERAL:  82 y.o.-year-old patient lying in the bed with no acute distress.  EYES: Pupils equal, round, reactive to light and accommodation. No scleral icterus.  Extraocular muscles intact.  HEENT: Head atraumatic, normocephalic. Oropharynx and nasopharynx clear.  NECK:  Supple, no jugular venous distention. No thyroid enlargement, no tenderness.  LUNGS: Decreased breath sounds bilaterally, slight expiratory wheezing. No rales,rhonchi or crepitation. No use of accessory muscles of respiration.  CARDIOVASCULAR: S1, S2 irregularly irregular tachycardic. No murmurs, rubs, or gallops.  ABDOMEN: Soft, nontender, nondistended. Bowel sounds present. No organomegaly or mass.  EXTREMITIES: Trace edema. No cyanosis,  or clubbing.  NEUROLOGIC: Cranial nerves II through XII are intact. Muscle strength 5/5 in all extremities. Sensation intact. Gait not checked.  PSYCHIATRIC: The patient is alert.  SKIN: No rash, lesion, or ulcer.   LABORATORY PANEL:   CBC  Recent Labs Lab 03/27/16 1559  WBC 6.9  HGB 13.8  HCT 39.4*  PLT 185   ------------------------------------------------------------------------------------------------------------------  Chemistries   Recent Labs Lab 03/27/16 1559  NA 118*  K 3.6  CL 83*  CO2 26  GLUCOSE 92  BUN 17  CREATININE 1.02  CALCIUM 8.7*  AST 59*  ALT 30  ALKPHOS 59  BILITOT 0.9   ------------------------------------------------------------------------------------------------------------------   EKG:   Atrial flutter 90 bpm incomplete right bundle branch block  IMPRESSION AND PLAN:   1.  Hyponatremia, dehydration. Likely secondary to nausea vomiting. Gentle IV fluid hydration. 2. Atrial flutter. Give IV magnesium. Hydrate. Start low-dose beta blocker. Echocardiogram. Start aspirin tomorrow. 3. Nausea vomiting and upper respiratory tract infection. Send a flu swab. Chest x-ray and abdominal x-ray. When necessary nausea medication with Zofran. Potential starting antibiotics depending on chest x-ray results. 4. History of dementia 5. Weakness get physical therapy evaluation  All the records are reviewed and case discussed with ED provider. Management plans discussed with the patient, family and they are in agreement.  CODE STATUS: Full code  TOTAL TIME TAKING CARE OF THIS PATIENT: 50 minutes.    Alford HighlandWIETING, Nahomi Hegner M.D on 03/27/2016 at 5:43 PM  Between 7am to 6pm - Pager - [phone]  After 6pm call admission pager [phone]  [facility]  [phone]  CC: Primary care physician; [place]

## 2016-03-27 NOTE — ED Notes (Signed)
Bladder scan 20mL

## 2016-03-27 NOTE — ED Notes (Signed)
Pt unable to urinate at this moment, given specimen cup for when is able to void and sent out to lobby.

## 2016-03-27 NOTE — ED Provider Notes (Signed)
[facility]-EMERGENCY DEPARTMENT Provider Note   CSN: [id number] Arrival date & time: 03/27/16  1545     History   Chief Complaint Chief Complaint  Patient presents with  . Emesis  . Near Syncope    HPI Johnathan Foster is a 82 y.o. male hx of dementia, Here presenting with dehydration, dizziness. Patient states that he has been vomiting for the last 3 days. He was unable to keep anything down including water. Denies any abdominal pain or chest pain. Patient has been feeling lightheaded and dizzy but didn't pass out. Has some sore throat since yesterday and hoarseness of his voice but denies trouble swallowing.   The history is provided by the patient.   Level V caveat- dementia   Past Medical History:  Diagnosis Date  . Abnormality of gait 12/24/2014  . Alzheimer's dementia   . BPH (benign prostatic hyperplasia)   . Fatigue   . Lower urinary tract symptoms   . Memory deficits 12/24/2014  . Memory loss     Patient Active Problem List   Diagnosis Date Noted  . BPH with obstruction/lower urinary tract symptoms 07/16/2015  . Surgery, elective 06/22/2015  . Memory deficits 12/24/2014  . Abnormality of gait 12/24/2014    Past Surgical History:  Procedure Laterality Date  . BONY PELVIS SURGERY    . FOOT SURGERY    . ORIF ELBOW FRACTURE Left 06/22/2015   Procedure: OPEN REDUCTION INTERNAL FIXATION (ORIF) ELBOW/OLECRANON FRACTURE;  Surgeon: Christena FlakeJohn J Poggi, MD;  Location: [facility] ORS;  Service: Orthopedics;  Laterality: Left;       Home Medications    Prior to Admission medications   Medication Sig Start Date End Date Taking? Authorizing Provider  aspirin 325 MG tablet Take 325 mg by mouth as needed. Reported on 07/16/2015    Historical Provider, MD  Calcium Carb-Cholecalciferol [phone] MG-UNIT CAPS Take by mouth. Reported on 07/16/2015    Historical Provider, MD  Calcium Carbonate-Vitamin D (CALCIUM-VITAMIN D) 600-125 MG-UNIT TABS Take 1 tablet by mouth daily.    Historical  Provider, MD  HYDROcodone-acetaminophen (NORCO) 5-325 MG tablet Take 1-2 tablets by mouth every 6 (six) hours as needed for moderate pain. MAXIMUM TOTAL ACETAMINOPHEN DOSE IS 4000 MG PER DAY 06/22/15   Christena FlakeJohn J Poggi, MD  MELATONIN ER PO Take 1 tablet by mouth at bedtime.    Historical Provider, MD  risperiDONE (RISPERDAL) 0.25 MG tablet Take by mouth. Reported on 07/16/2015 06/30/15   Historical Provider, MD  zolpidem (AMBIEN) 5 MG tablet Take 5 mg by mouth at bedtime as needed for sleep. Reported on 10/26/2015    Historical Provider, MD    Family History Family History  Problem Relation Age of Onset  . Cancer Sister   . Cancer Sister   . Kidney disease Neg Hx   . Prostate cancer Neg Hx     Social History Social History  Substance Use Topics  . Smoking status: Former Smoker    Packs/day: 1.00    Years: 20.00    Types: Cigarettes    Quit date: 05/29/1956  . Smokeless tobacco: Never Used  . Alcohol use No     Allergies   Review of patient's allergies indicates no known allergies.   Review of Systems Review of Systems  Cardiovascular: Positive for near-syncope.  Gastrointestinal: Positive for vomiting.  All other systems reviewed and are negative.    Physical Exam Updated Vital Signs BP (!) 165/109 (BP Location: Left Arm)   Pulse (!) 105   Temp  98.5 F (36.9 C) (Oral)   Resp 16   Ht 5\' 8"  (1.727 m)   Wt 152 lb (68.9 kg)   SpO2 98%   BMI 23.11 kg/m   Physical Exam  Constitutional:  Chronically ill, dehydrated   HENT:  Head: Normocephalic.  MM dry   Eyes: EOM are normal. Pupils are equal, round, and reactive to light.  Neck: Normal range of motion. Neck supple.  Cardiovascular: Normal rate, regular rhythm and normal heart sounds.   Pulmonary/Chest: Effort normal and breath sounds normal. No respiratory distress. He has no wheezes. He has no rales.  Abdominal: Soft. Bowel sounds are normal. He exhibits no distension. There is no tenderness. There is no guarding.    Musculoskeletal: Normal range of motion.  Neurological: He is alert.  Demented. CN 2-12 intact. Nl strength throughout   Skin: Skin is warm.  Psychiatric: He has a normal mood and affect.  Nursing note and vitals reviewed.    ED Treatments / Results  Labs (all labs ordered are listed, but only abnormal results are displayed) Labs Reviewed  COMPREHENSIVE METABOLIC PANEL - Abnormal; Notable for the following:       Result Value   Sodium 118 (*)    Chloride 83 (*)    Calcium 8.7 (*)    AST 59 (*)    All other components within normal limits  CBC - Abnormal; Notable for the following:    RBC 3.96 (*)    HCT 39.4 (*)    MCH 34.8 (*)    All other components within normal limits  LIPASE, BLOOD  URINALYSIS COMPLETEWITH MICROSCOPIC ([facility] ONLY)    EKG  EKG Interpretation None      ED ECG REPORT I, Richardean Canalavid H Yao, the attending physician, personally viewed and interpreted this ECG.   Date: 03/27/2016  EKG Time:16:08  Rate: 90  Rhythm: aflutter vs NSR  Axis: normal  Intervals:none  ST&T Change: nonspecific    Radiology No results found.  Procedures Procedures (including critical care time)  CRITICAL CARE Performed by: Richardean Canalavid H Yao   Total critical care time: 30 minutes  Critical care time was exclusive of separately billable procedures and treating other patients.  Critical care was necessary to treat or prevent imminent or life-threatening deterioration.  Critical care was time spent personally by me on the following activities: development of treatment plan with patient and/or surrogate as well as nursing, discussions with consultants, evaluation of patient's response to treatment, examination of patient, obtaining history from patient or surrogate, ordering and performing treatments and interventions, ordering and review of laboratory studies, ordering and review of radiographic studies, pulse oximetry and re-evaluation of patient's condition.   Medications  Ordered in ED Medications  sodium chloride 0.9 % bolus 1,000 mL (1,000 mLs Intravenous New Bag/Given 03/27/16 1705)  ondansetron (ZOFRAN) injection 4 mg (4 mg Intravenous Given 03/27/16 1707)     Initial Impression / Assessment and Plan / ED Course  I have reviewed the triage vital signs and the nursing notes.  Pertinent labs & imaging results that were available during my care of the patient were reviewed by me and considered in my medical decision making (see chart for details).  Clinical Course    Johnathan Foster is a 82 y.o. male here with dehydration. Abdomen nontender. Has viral syndrome as well. Posterior pharynx clear with no redness or signs of PTA. No stridor. Lungs clear. Abdomen nontender. Labs showed Na 118, likely from dehydration. Not on diuretics at home.  Given 1 L NS bolus. Will admit for hydration. Patient had EKG that showed possible aflutter but is rate controlled around 100. Hospitalist aware about it and will monitor on tele and discuss rate control and anticoagulation inpatient.   Final Clinical Impressions(s) / ED Diagnoses   Final diagnoses:  None    New Prescriptions New Prescriptions   No medications on file     Charlynne Pander, MD 03/27/16 1720

## 2016-03-27 NOTE — ED Triage Notes (Addendum)
C/O vomiting since Saturday.  Yesterday patient tolerating liquids but did not want to eat any food.  Mucinex given.  Today was able to tolerate a piece of cake, but really has not eaten a lot over the past three days.  Patient c/o dizziness.  C/O sore throat with swallowing.  Throat pink.

## 2016-03-28 ENCOUNTER — Inpatient Hospital Stay (HOSPITAL_COMMUNITY)
Admit: 2016-03-28 | Discharge: 2016-03-28 | Disposition: A | Discharge status: Home / Self Care | Payer: Medicare Other | Attending: Internal Medicine | Admitting: Internal Medicine

## 2016-03-28 ENCOUNTER — Inpatient Hospital Stay: Payer: Medicare Other

## 2016-03-28 DIAGNOSIS — F0280 Dementia in other diseases classified elsewhere without behavioral disturbance: Secondary | ICD-10-CM | POA: Diagnosis present

## 2016-03-28 DIAGNOSIS — G309 Alzheimer's disease, unspecified: Secondary | ICD-10-CM

## 2016-03-28 DIAGNOSIS — I4892 Unspecified atrial flutter: Secondary | ICD-10-CM | POA: Diagnosis present

## 2016-03-28 DIAGNOSIS — R112 Nausea with vomiting, unspecified: Secondary | ICD-10-CM | POA: Diagnosis present

## 2016-03-28 DIAGNOSIS — R931 Abnormal findings on diagnostic imaging of heart and coronary circulation: Secondary | ICD-10-CM

## 2016-03-28 DIAGNOSIS — E871 Hypo-osmolality and hyponatremia: Principal | ICD-10-CM

## 2016-03-28 LAB — CBC
HCT: 37.5 % — ABNORMAL LOW (ref 40.0–52.0)
Hemoglobin: 13.2 g/dL (ref 13.0–18.0)
MCH: 35.2 pg — ABNORMAL HIGH (ref 26.0–34.0)
MCHC: 35.2 g/dL (ref 32.0–36.0)
MCV: 100.1 fL — ABNORMAL HIGH (ref 80.0–100.0)
PLATELETS: 177 10*3/uL (ref 150–440)
RBC: 3.74 MIL/uL — ABNORMAL LOW (ref 4.40–5.90)
RDW: 13.7 % (ref 11.5–14.5)
WBC: 7.5 10*3/uL (ref 3.8–10.6)

## 2016-03-28 LAB — BASIC METABOLIC PANEL
Anion gap: 6 (ref 5–15)
BUN: 18 mg/dL (ref 6–20)
CALCIUM: 8.4 mg/dL — AB (ref 8.9–10.3)
CO2: 26 mmol/L (ref 22–32)
CREATININE: 1.02 mg/dL (ref 0.61–1.24)
Chloride: 97 mmol/L — ABNORMAL LOW (ref 101–111)
GFR calc Af Amer: >60 mL/min (ref >60)
GLUCOSE: 86 mg/dL (ref 65–99)
Potassium: 3.9 mmol/L (ref 3.5–5.1)
SODIUM: 129 mmol/L — AB (ref 135–145)

## 2016-03-28 LAB — ECHOCARDIOGRAM COMPLETE
Height: 68 in
WEIGHTICAEL: 2480 [oz_av]

## 2016-03-28 LAB — LIPID PANEL
CHOLESTEROL: 153 mg/dL (ref 0–200)
HDL: 56 mg/dL (ref >40)
LDL Cholesterol: 89 mg/dL (ref 0–99)
TRIGLYCERIDES: 42 mg/dL (ref <150)
Total CHOL/HDL Ratio: 2.7 RATIO
VLDL: 8 mg/dL (ref 0–40)

## 2016-03-28 LAB — TROPONIN I
TROPONIN I: 0.03 ng/mL — AB (ref <0.03)
Troponin I: 0.04 ng/mL (ref <0.03)
Troponin I: 0.04 ng/mL (ref <0.03)

## 2016-03-28 LAB — MAGNESIUM: MAGNESIUM: 2 mg/dL (ref 1.7–2.4)

## 2016-03-28 LAB — OSMOLALITY, URINE: Osmolality, Ur: 278 mOsm/kg — ABNORMAL LOW (ref 300–900)

## 2016-03-28 MED ORDER — HALOPERIDOL LACTATE 5 MG/ML IJ SOLN
1.0000 mg | Freq: Four times a day (QID) | INTRAMUSCULAR | Status: DC | PRN
  Administered 2016-03-28 – 2016-03-30 (×2): 1 mg via INTRAVENOUS
  Filled 2016-03-28 (×2): qty 1

## 2016-03-28 MED ORDER — CEPASTAT 14.5 MG MT LOZG
1.0000 | LOZENGE | OROMUCOSAL | Status: DC | PRN
  Filled 2016-03-28: qty 9

## 2016-03-28 MED ORDER — DOCUSATE SODIUM 100 MG PO CAPS
100.0000 mg | ORAL_CAPSULE | Freq: Two times a day (BID) | ORAL | Status: DC
  Administered 2016-03-28 – 2016-03-30 (×4): 100 mg via ORAL
  Filled 2016-03-28 (×4): qty 1

## 2016-03-28 MED ORDER — IOPAMIDOL (ISOVUE-370) INJECTION 76%
75.0000 mL | Freq: Once | INTRAVENOUS | Status: AC | PRN
  Administered 2016-03-28: 75 mL via INTRAVENOUS

## 2016-03-28 MED ORDER — METOPROLOL TARTRATE 5 MG/5ML IV SOLN
2.5000 mg | Freq: Once | INTRAVENOUS | Status: AC
  Administered 2016-03-28: 08:00:00 2.5 mg via INTRAVENOUS
  Filled 2016-03-28: qty 5

## 2016-03-28 MED ORDER — METOPROLOL TARTRATE 25 MG PO TABS
25.0000 mg | ORAL_TABLET | Freq: Two times a day (BID) | ORAL | Status: DC
  Administered 2016-03-28 (×2): 25 mg via ORAL
  Filled 2016-03-28 (×2): qty 1

## 2016-03-28 MED ORDER — POLYETHYLENE GLYCOL 3350 17 G PO PACK
17.0000 g | PACK | Freq: Every day | ORAL | Status: DC
  Administered 2016-03-29 – 2016-03-30 (×2): 17 g via ORAL
  Filled 2016-03-28 (×2): qty 1

## 2016-03-28 NOTE — Evaluation (Addendum)
Physical Therapy Evaluation Patient Details Name: Johnathan Foster MRN: [id number] DOB: 03-12-1925 Today's Date: 03/28/2016   History of Present Illness  Pt is an 82 y.o. male presenting to hospital with emesis, near syncope (feeling lightheaded and dizzy).  Pt admitted with newly diagnosed a-flutter, hyponatremia, dehydration, N/V, and upper respiratory tract infection.  PMH includes Alzheimer's dementia, gait abnormalities, memory loss, foot surgery, frequent falls, BPH, L1 vertebral body collapse, L olecranon fx s/p fixation 06/22/15.  Clinical Impression  Prior to admission, pt was independent with functional mobility.  Pt lives with his son in 1 level home with stairs to enter.  Currently pt is SBA with bed mobility and min asist to transfer and ambulate without AD.  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  Pt would benefit from skilled PT to address noted impairments and functional limitations.  Anticipate with continued ambulation during hospital stay pt will be close to baseline and be able to return home with support from family.   Follow Up Recommendations Home health PT (pending pt's progress)    Equipment Recommendations   (TBD)    Recommendations for Other Services       Precautions / Restrictions Precautions Precautions: Fall Restrictions Weight Bearing Restrictions: No      Mobility  Bed Mobility Overal bed mobility: Needs Assistance Bed Mobility: Supine to Sit;Sit to Supine     Supine to sit: Supervision Sit to supine: Supervision   General bed mobility comments: SBA for safety  Transfers Overall transfer level: Needs assistance Equipment used: None Transfers: Sit to/from Stand Sit to Stand: Min assist         General transfer comment: pt holding onto IV pole to steady self upon standing  Ambulation/Gait Ambulation/Gait assistance: Min assist Ambulation Distance (Feet): 60  Feet Assistive device: None   Gait velocity: decreased   General Gait Details: pt initially unsteady holding onto objects in room then holding onto IV pole and then eventually no AD; mildly unsteady without AD; (pt's son reporting pt baseline initially holds onto items when first standing but improves with distance (transitioning to no AD)   Stairs            Wheelchair Mobility    Modified Rankin (Stroke Patients Only)       Balance Overall balance assessment: Needs assistance Sitting-balance support: No upper extremity supported;Feet supported Sitting balance-Leahy Scale: Normal     Standing balance support: No upper extremity supported Standing balance-Leahy Scale: Fair Standing balance comment: static standing                             Pertinent Vitals/Pain Pain Assessment: No/denies pain  Vitals stable and WFL throughout treatment session.    Home Living Family/patient expects to be discharged to:: Private residence Living Arrangements: Children (Pt's son Johnathan Foster) Available Help at Discharge: Family Type of Home: House Home Access: Stairs to enter   Entergy CorporationEntrance Stairs-Number of Steps: 3 with railing (pt unsure which side) Home Layout: One level Home Equipment: None      Prior Function Level of Independence: Independent         Comments: Pt denies any falls in past 6 months.     Hand Dominance        Extremity/Trunk Assessment   Upper Extremity Assessment: Generalized weakness           Lower Extremity Assessment: Generalized weakness  Communication   Communication: HOH  Cognition Arousal/Alertness: Awake/alert Behavior During Therapy: Impulsive Overall Cognitive Status:  (Oriented to name and DOB (except not month); may be complicated d/t HOH)                      General Comments General comments (skin integrity, edema, etc.): Pt's grandson present during session.  Nursing cleared pt for participation in  physical therapy.  Pt agreeable to PT session.  MD Johnathan Foster consulted in person prior to AM PT session and MD cleared pt to participate in PT.    Exercises     Assessment/Plan    PT Assessment Patient needs continued PT services  PT Problem List Decreased balance;Decreased mobility          PT Treatment Interventions DME instruction;Gait training;Stair training;Functional mobility training;Therapeutic activities;Therapeutic exercise;Balance training;Patient/family education    PT Goals (Current goals can be found in the Care Plan section)  Acute Rehab PT Goals Patient Stated Goal: to go home PT Goal Formulation: With patient Time For Goal Achievement: 04/11/16 Potential to Achieve Goals: Good    Frequency Min 2X/week   Barriers to discharge Decreased caregiver support      Co-evaluation               End of Session Equipment Utilized During Treatment: Gait belt Activity Tolerance: Patient tolerated treatment well Patient left: in bed;with call bell/phone within reach;with bed alarm set;with family/visitor present (2 floor fall mats in place (on each side of bed)) Nurse Communication: Mobility status;Precautions         Time: 1140-1205 PT Time Calculation (min) (ACUTE ONLY): 25 min   Charges:   PT Evaluation $PT Eval Low Complexity: 1 Procedure     PT G CodesHendricks Foster:        Johnathan Foster 03/28/2016, 1:54 PM Johnathan LimesEmily Brooklinn Foster, PT [phone]

## 2016-03-28 NOTE — Progress Notes (Signed)
Patient had a hard time with calcium pill coughed and spat after taking, lung sound remained clear and diminished.  Recommend breaking or crushing any larger pills

## 2016-03-28 NOTE — Progress Notes (Signed)
[facility] [place] [facility]   PATIENT NAME: Johnathan Foster    MR#:  [id number]  DATE OF BIRTH:  [birth year]  SUBJECTIVE:  CHIEF COMPLAINT:   Chief Complaint  Patient presents with  . Emesis  . Near Syncope   The patient is 82 year old Caucasian male with past medical history significant for history of dementia, BPH, who presents to the hospital with complaints of not feeling well, having nauseated and vomiting, having dizziness for the past 2 days. On arrival to the hospital. Patient's sodium level was found to be 118, he was admitted to the hospital for IV fluid administration. Due to concerns of dehydration. His sodium level has improved to 129 today. Patient is confused, getting out of the bed and trying to leave , not able to review systems, denies any pain or discomfort Review of Systems  Constitutional: Negative for chills, fever and weight loss.  HENT: Negative for congestion.   Eyes: Negative for blurred vision and double vision.  Respiratory: Negative for cough, sputum production, shortness of breath and wheezing.   Cardiovascular: Negative for chest pain, palpitations, orthopnea, leg swelling and PND.  Gastrointestinal: Negative for abdominal pain, blood in stool, constipation, diarrhea, nausea and vomiting.  Genitourinary: Negative for dysuria, frequency, hematuria and urgency.  Musculoskeletal: Negative for falls.  Neurological: Negative for dizziness, tremors, focal weakness and headaches.  Endo/Heme/Allergies: Does not bruise/bleed easily.  Psychiatric/Behavioral: Negative for depression. The patient does not have insomnia.     VITAL SIGNS: Blood pressure (!) 120/97, pulse (!) 106, temperature 98 F (36.7 C), temperature source Oral, resp. rate 18, height 5\' 8"  (1.727 m), weight 70.3 kg (155 lb), SpO2 98 %.  PHYSICAL EXAMINATION:   GENERAL:  82 y.o.-year-old patient lying in the bed with no acute distress, confused, but answers some  questions appropriately.  EYES: Pupils equal, round, reactive to light and accommodation. No scleral icterus. Extraocular muscles intact.  HEENT: Head atraumatic, normocephalic. Oropharynx and nasopharynx clear.  NECK:  Supple, no jugular venous distention. No thyroid enlargement, no tenderness.  LUNGS: Normal breath sounds bilaterally, no wheezing, rales,rhonchi or crepitation. No use of accessory muscles of respiration.  CARDIOVASCULAR: S1, S2 , rhythm is regular. . 3 out of 6 systolic, and diastolic murmur were heard in all precordium, no rubs, or gallops.  ABDOMEN: Soft, nontender, nondistended. Bowel sounds present. No organomegaly or mass.  EXTREMITIES: No pedal edema, cyanosis, or clubbing.  NEUROLOGIC: Cranial nerves II through XII are intact. Muscle strength 5/5 in all extremities. Sensation intact. Gait not checked.  PSYCHIATRIC: The patient is alert and oriented x 1.  SKIN: No obvious rash, lesion, or ulcer.   ORDERS/RESULTS REVIEWED:   CBC  Recent Labs Lab 03/27/16 1559 03/28/16 0440  WBC 6.9 7.5  HGB 13.8 13.2  HCT 39.4* 37.5*  PLT 185 177  MCV 99.5 100.1*  MCH 34.8* 35.2*  MCHC 35.0 35.2  RDW 13.5 13.7   ------------------------------------------------------------------------------------------------------------------  Chemistries   Recent Labs Lab 03/27/16 1559 03/27/16 2213 03/28/16 0440  NA 118* 128* 129*  K 3.6 3.9 3.9  CL 83* 95* 97*  CO2 26 25 26   GLUCOSE 92 85 86  BUN 17 18 18   CREATININE 1.02 0.95 1.02  CALCIUM 8.7* 8.6* 8.4*  AST 59*  --   --   ALT 30  --   --   ALKPHOS 59  --   --   BILITOT 0.9  --   --    ------------------------------------------------------------------------------------------------------------------ estimated creatinine clearance  is 47.5 mL/min (by C-G formula based on SCr of 1.02 mg/dL). ------------------------------------------------------------------------------------------------------------------ No results for  input(s): TSH, T4TOTAL, T3FREE, THYROIDAB in the last 72 hours.  Invalid input(s): FREET3  Cardiac Enzymes  Recent Labs Lab 03/27/16 1559 03/28/16 0705  TROPONINI 0.03* 0.03*   ------------------------------------------------------------------------------------------------------------------ Invalid input(s): POCBNP ---------------------------------------------------------------------------------------------------------------  RADIOLOGY: Dg Abd Acute W/chest  Result Date: 03/27/2016 CLINICAL DATA:  Dehydration and dizziness EXAM: DG ABDOMEN ACUTE W/ 1V CHEST COMPARISON:  November 07, 2012 chest radiograph FINDINGS: PA chest: There is no edema or consolidation. Heart is mildly enlarged with pulmonary vascularity within normal limits. Aorta is tortuous with atherosclerotic calcification in the aorta. There is degenerative change in each shoulder. Supine and upright abdomen: There is moderate stool throughout the colon. There is no bowel dilatation or air-fluid level suggesting bowel obstruction. No free air. Bones are osteoporotic. There is marked collapse of the L1 vertebral body. IMPRESSION: No bowel obstruction or free air. No lung edema or consolidation. Aortic atherosclerosis present. Mild cardiomegaly. Marked collapse of the L1 vertebral body. Electronically Signed   By: Bretta BangWilliam  Woodruff III M.D.   On: 03/27/2016 18:32    EKG:  Orders placed or performed during the hospital encounter of 03/27/16  . ED EKG  . ED EKG  . EKG 12-Lead  . EKG 12-Lead    ASSESSMENT AND PLAN:  Principal Problem:   Hyponatremia Active Problems:   Alzheimer's dementia   Nausea and vomiting   Atrial flutter (HCC)  #1. Hyponatremia due to dehydration, continue IV fluids, follow sodium level in the morning, sodium has improved to 129 today #2. Dehydration due to nausea and vomiting, follow oral intake #3. Constipation, initiate patient on MiraLAX and Colace. Add enema if needed, follow oral intake after  constipation is relieved #4. Nausea, vomiting, possibly related to constipation, supportive therapy, IV fluids #5 atrial flutter, continue metoprolol, get cardiologist and also recommendations, echo revealed normal ejection fraction, mild-to-moderate aortic insufficiency, moderate to severe tricuspid regurgitation, dilated right side of the heart, get CTA of the chest to rule out pulmonary embolism  Management plans discussed with the patient, family and they are in agreement.   DRUG ALLERGIES: No Known Allergies  CODE STATUS:     Code Status Orders        Start     Ordered   03/27/16 1741  Full code  Continuous     03/27/16 1740    Code Status History    Date Active Date Inactive Code Status Order ID Comments User Context   06/22/2015  7:16 PM 06/23/2015  2:42 PM Full Code [id number]  Christena FlakeJohn J Poggi, MD Inpatient      TOTAL TIME TAKING CARE OF THIS PATIENT: 45 minutes.  Discussed this patient's grandson, who is primary caregiver for the patient, attempted to teach patient's daughter, however, her telephone reveals  busy signal on several attempts.   Katharina CaperVAICKUTE,Lynley Killilea M.D on 03/28/2016 at 12:39 PM  Between 7am to 6pm - Pager - [phone]  After 6pm go to [email or web address] - password [email or web address]  [facility]  Office  [phone]  CC: Primary care physician; No primary care provider on file.

## 2016-03-28 NOTE — Care Management Important Message (Signed)
Important Message  Patient Details  Name: Johnathan ClossCarl W Gentzler MRN: [id number] Date of Birth: 09/09/1924   Medicare Important Message Given:  Yes    Gwenette GreetBrenda S Adalei Novell, RN 03/28/2016, 8:22 AM

## 2016-03-28 NOTE — Consult Note (Signed)
Cardiology Consultation Note  Patient ID: Johnathan Foster, MRN: [id number], DOB/AGE: 07/05/1924 82 y.o. Admit date: 03/27/2016   Date of Consult: 03/28/2016 Primary Physician: No primary care provider on file. Primary Cardiologist: New to [facility] Requesting Physician: Dr. Renae GlossWieting, MD  Chief Complaint: Nausea and vomiting, found to have Na of 118 Reason for Consult: Newly diagnosed atrial flutter  HPI: 82 y.o. male with h/o Alzheimer's dementia, frequent falls, and BPH who [facility] with a several day history of nausea and vomiting and was found to have hyponatremia of 118 and newly diagnosed atrial flutter.   History is mostly taken from prior note as patient has severe dementia. He has had decreased PO intake x 2 days with associated nausea and vomiting and dizziness. Never with any chest pain, SOB, palpitations, presyncope, or syncope. He reports feeling "drunk."   [facility] they were found to have flat trending troponin of 0.03 x 2, Na 118-->129, K+ 3.9, Scr 1.02, hgb 13.2, wbc 6.9. ECG as below, CXR not done. Abdominal plain film without obstruction or free air.    Past Medical History:  Diagnosis Date  . Abnormality of gait 12/24/2014  . Alzheimer's dementia   . BPH (benign prostatic hyperplasia)   . Fatigue   . Lower urinary tract symptoms   . Memory deficits 12/24/2014  . Memory loss       Most Recent Cardiac Studies: None   Surgical History:  Past Surgical History:  Procedure Laterality Date  . BONY PELVIS SURGERY    . FOOT SURGERY    . ORIF ELBOW FRACTURE Left 06/22/2015   Procedure: OPEN REDUCTION INTERNAL FIXATION (ORIF) ELBOW/OLECRANON FRACTURE;  Surgeon: Christena FlakeJohn J Poggi, MD;  Location: [facility] ORS;  Service: Orthopedics;  Laterality: Left;     Home Meds: Prior to Admission medications   Medication Sig Start Date End Date Taking? Authorizing Provider  aspirin 325 MG tablet Take 325 mg by mouth daily.   Yes Historical Provider, MD  Calcium  Carbonate-Vitamin D (CALCIUM-VITAMIN D) 600-125 MG-UNIT TABS Take 1 tablet by mouth daily.   Yes Historical Provider, MD    Inpatient Medications:  . aspirin EC  81 mg Oral Daily  . azithromycin  250 mg Oral q1800  . calcium-vitamin D  1 tablet Oral Daily  . cefTRIAXone  1 g Intravenous q1800  . enoxaparin (LOVENOX) injection  40 mg Subcutaneous Q24H  . feeding supplement (ENSURE ENLIVE)  237 mL Oral BID BM  . metoprolol tartrate  12.5 mg Oral BID  . pneumococcal 23 valent vaccine  0.5 mL Intramuscular Tomorrow-1000   . sodium chloride 50 mL/hr at 03/27/16 2110    Allergies: No Known Allergies  Social History   Social History  . Marital status: Widowed    Spouse name: N/A  . Number of children: 4  . Years of education: N/A   Occupational History  . Not on file.   Social History Main Topics  . Smoking status: Former Smoker    Packs/day: 1.00    Years: 20.00    Types: Cigarettes    Quit date: 05/29/1956  . Smokeless tobacco: Never Used  . Alcohol use No  . Drug use: No  . Sexual activity: Not on file   Other Topics Concern  . Not on file   Social History Narrative   Patient lives at home with his son.   Caffeine Use: none     Family History  Problem Relation Age of Onset  .  CAD Father   . Cancer Sister   . Cancer Sister   . Kidney disease Neg Hx   . Prostate cancer Neg Hx      Review of Systems: Review of Systems  Unable to perform ROS: Dementia    Labs:  Recent Labs  03/27/16 1559 03/28/16 0705  TROPONINI 0.03* 0.03*   Lab Results  Component Value Date   WBC 7.5 03/28/2016   HGB 13.2 03/28/2016   HCT 37.5 (L) 03/28/2016   MCV 100.1 (H) 03/28/2016   PLT 177 03/28/2016    Recent Labs Lab 03/27/16 1559  03/28/16 0440  NA 118*  < > 129*  K 3.6  < > 3.9  CL 83*  < > 97*  CO2 26  < > 26  BUN 17  < > 18  CREATININE 1.02  < > 1.02  CALCIUM 8.7*  < > 8.4*  PROT 6.8  --   --   BILITOT 0.9  --   --   ALKPHOS 59  --   --   ALT 30  --   --     AST 59*  --   --   GLUCOSE 92  < > 86  < > = values in this interval not displayed. No results found for: CHOL, HDL, LDLCALC, TRIG No results found for: DDIMER  Radiology/Studies:  Dg Abd Acute W/chest  Result Date: 03/27/2016 CLINICAL DATA:  Dehydration and dizziness EXAM: DG ABDOMEN ACUTE W/ 1V CHEST COMPARISON:  November 07, 2012 chest radiograph FINDINGS: PA chest: There is no edema or consolidation. Heart is mildly enlarged with pulmonary vascularity within normal limits. Aorta is tortuous with atherosclerotic calcification in the aorta. There is degenerative change in each shoulder. Supine and upright abdomen: There is moderate stool throughout the colon. There is no bowel dilatation or air-fluid level suggesting bowel obstruction. No free air. Bones are osteoporotic. There is marked collapse of the L1 vertebral body. IMPRESSION: No bowel obstruction or free air. No lung edema or consolidation. Aortic atherosclerosis present. Mild cardiomegaly. Marked collapse of the L1 vertebral body. Electronically Signed   By: Bretta Bang III M.D.   On: 03/27/2016 18:32    EKG: Interpreted by me showed: Atrial flutter, 90 bpm, nonspecific st/t changes   Telemetry: Interpreted by me showed: Atrial flutter with variable AV block  Weights: Filed Weights   03/27/16 1557 03/27/16 2022  Weight: 152 lb (68.9 kg) 155 lb (70.3 kg)     Physical Exam: Blood pressure 114/81, pulse 100, temperature 98 F (36.7 C), temperature source Oral, resp. rate 18, height 5\' 8"  (1.727 m), weight 155 lb (70.3 kg), SpO2 98 %. Body mass index is 23.57 kg/m. General: Well developed, well nourished, in no acute distress. Head: Normocephalic, atraumatic, sclera non-icteric, no xanthomas, nares are without discharge.  Neck: Negative for carotid bruits. JVD not elevated. Lungs: Clear bilaterally to auscultation without wheezes, rales, or rhonchi. Breathing is unlabored. Heart: Irregular, tachycardic with S1 S2. No  murmurs, rubs, or gallops appreciated. Abdomen: Soft, non-tender, non-distended with normoactive bowel sounds. No hepatomegaly. No rebound/guarding. No obvious abdominal masses. Msk:  Strength and tone appear normal for age. Extremities: No clubbing or cyanosis. No edema. Distal pedal pulses are 2+ and equal bilaterally. Neuro: Alert. No facial asymmetry. No focal deficit. Moves all extremities spontaneously. Psych:  Responds to questions.    Assessment and Plan:  Principal Problem:   Hyponatremia Active Problems:   Alzheimer's dementia   Nausea and vomiting   Atrial  flutter (HCC)    1. Newly diagnosed atrial flutter with RVR: -Rate is reasonably well controlled at this time in the low 100's bpm at rest -Can titrate Lopressor to 25 mg bid with hold parameters -Conservatively manage given his falls and dementia  -Possibly in the setting of his nausea and vomiting -K+ ok -Check magnesium level, though this has been repleted upon admission -On ASA per IM -Not a candidate for full-dose anticoagulation given his dementia as well as frequent falls -Unlikely to be a candidate for atrial flutter ablation given his dementia and inability to take full-dose anticoagulation  -CHADS2VASc at least 2 (age x 2) -Check A1C and lipid for further risk stratification  -He is aware of increased stroke risk not being on anticoagulation   2. Nausea and vomiting/hyponatremia: -Improved -Consider changing to PO fluids soon, defer to IM  3. Alzheimer's dementia: -Per IM  4. Frequent falls: -Needs PT evaluation   5. Abnormal echo: -Severely dilated right ventricle, possibly in the setting of his valvular disease  -Cannot rule out PE at this time  -Recommend CTA chest PE protocol, defer to IM   Signed, Eula Listenyan Briana Newman, PA-C [facility] Pager: [phone] 03/28/2016, 9:44 AM

## 2016-03-28 NOTE — Progress Notes (Signed)
Safety mats placed in room, patient trying to get out of bed often MD contacted and sitter ordered.

## 2016-03-28 NOTE — Progress Notes (Signed)
*  PRELIMINARY RESULTS* Echocardiogram 2D Echocardiogram has been performed.  Cristela BlueHege, Yazmen Briones 03/28/2016, 8:27 AM

## 2016-03-29 LAB — SODIUM: Sodium: 134 mmol/L — ABNORMAL LOW (ref 135–145)

## 2016-03-29 LAB — TROPONIN I
TROPONIN I: 0.03 ng/mL — AB (ref <0.03)
TROPONIN I: 0.04 ng/mL — AB (ref <0.03)
TROPONIN I: 0.03 ng/mL — AB (ref <0.03)
Troponin I: 0.05 ng/mL (ref <0.03)

## 2016-03-29 LAB — HEMOGLOBIN A1C
Hgb A1c MFr Bld: 5.2 % (ref 4.8–5.6)
MEAN PLASMA GLUCOSE: 103 mg/dL

## 2016-03-29 MED ORDER — LEVOFLOXACIN 500 MG PO TABS
500.0000 mg | ORAL_TABLET | Freq: Every day | ORAL | Status: DC
  Administered 2016-03-29 – 2016-03-30 (×2): 500 mg via ORAL
  Filled 2016-03-29 (×2): qty 1

## 2016-03-29 MED ORDER — DILTIAZEM HCL 30 MG PO TABS
30.0000 mg | ORAL_TABLET | Freq: Four times a day (QID) | ORAL | Status: DC
  Administered 2016-03-29 – 2016-03-30 (×5): 30 mg via ORAL
  Filled 2016-03-29 (×5): qty 1

## 2016-03-29 NOTE — Progress Notes (Signed)
Patient: Johnathan ClossCarl W Riling / Admit Date: 03/27/2016 / Date of Encounter: 03/29/2016, 7:45 AM   Subjective: No acute overnight events. Sitter placed in the room 2/2 patient frequently attempting to get out of bed. Notes hoarse voice this morning. Echo showed normal EF, moderate AI, mild MR, moderately to severely dilated RV, moderate to severe TR. Had head CT on 10/31 which was negative. Had CTA chest on 10/31 which was negative for PE. Troponin level continues to be cycled, unclear why as his minimal flat trending level is likely 2/2 atrial flutter with RVR.   Review of Systems: Review of Systems  Unable to perform ROS: Dementia    Objective: Telemetry: atrial flutter with variable AV block, 90's to low 100's bpm Physical Exam: Blood pressure (!) 143/100, pulse 100, temperature 97.5 F (36.4 C), temperature source Oral, resp. rate 18, height 5\' 8"  (1.727 m), weight 155 lb (70.3 kg), SpO2 96 %. Body mass index is 23.57 kg/m. General: Well developed, well nourished, in no acute distress. Sitter in room with pads on the floor and bed lowered to lowest position.  Head: Normocephalic, atraumatic, sclera non-icteric, no xanthomas, nares are without discharge. Hoarse voice.  Neck: Negative for carotid bruits. JVP not elevated. Lungs: Bilateral wheezing. Breathing is unlabored. Heart: Irregular, tachycardic, S1 S2 without murmurs, rubs, or gallops.  Abdomen: Soft, non-tender, non-distended with normoactive bowel sounds. No rebound/guarding. Extremities: No clubbing or cyanosis. No edema. Distal pedal pulses are 2+ and equal bilaterally. Neuro: Alert. Moves all extremities spontaneously. Psych:  Responds to questions.   Intake/Output Summary (Last 24 hours) at 03/29/16 0745 Last data filed at 03/29/16 0500  Gross per 24 hour  Intake           1527.5 ml  Output              950 ml  Net            577.5 ml    Inpatient Medications:  . aspirin EC  81 mg Oral Daily  . azithromycin  250 mg  Oral q1800  . calcium-vitamin D  1 tablet Oral Daily  . cefTRIAXone  1 g Intravenous q1800  . diltiazem  30 mg Oral Q6H  . docusate sodium  100 mg Oral BID  . enoxaparin (LOVENOX) injection  40 mg Subcutaneous Q24H  . feeding supplement (ENSURE ENLIVE)  237 mL Oral BID BM  . pneumococcal 23 valent vaccine  0.5 mL Intramuscular Tomorrow-1000  . polyethylene glycol  17 g Oral Daily   Infusions:  . sodium chloride 50 mL/hr at 03/28/16 1714    Labs:  Recent Labs  03/27/16 2213 03/28/16 0440 03/28/16 1319 03/29/16 0110  NA 128* 129*  --  134*  K 3.9 3.9  --   --   CL 95* 97*  --   --   CO2 25 26  --   --   GLUCOSE 85 86  --   --   BUN 18 18  --   --   CREATININE 0.95 1.02  --   --   CALCIUM 8.6* 8.4*  --   --   MG  --   --  2.0  --     Recent Labs  03/27/16 1559  AST 59*  ALT 30  ALKPHOS 59  BILITOT 0.9  PROT 6.8  ALBUMIN 3.7    Recent Labs  03/27/16 1559 03/28/16 0440  WBC 6.9 7.5  HGB 13.8 13.2  HCT 39.4* 37.5*  MCV  99.5 100.1*  PLT 185 177    Recent Labs  03/28/16 0705 03/28/16 1319 03/28/16 1848 03/29/16 0110  TROPONINI 0.03* 0.04* 0.04* 0.05*   Invalid input(s): POCBNP  Recent Labs  03/28/16 1319  HGBA1C 5.2     Weights: Filed Weights   03/27/16 1557 03/27/16 2022  Weight: 152 lb (68.9 kg) 155 lb (70.3 kg)     Radiology/Studies:  Ct Head Wo Contrast  Result Date: 03/28/2016 CLINICAL DATA:  Confusion. EXAM: CT HEAD WITHOUT CONTRAST TECHNIQUE: Contiguous axial images were obtained from the base of the skull through the vertex without intravenous contrast. COMPARISON:  None. FINDINGS: Brain: There is atrophy and chronic small vessel disease changes. Associated ventriculomegaly, likely related to ex vacuo dilatation. No acute intracranial abnormality. Specifically, no hemorrhage, hydrocephalus, mass lesion, acute infarction, or significant intracranial injury. Vascular: No hyperdense vessel or unexpected calcification. Skull: No acute  calvarial abnormality. Sinuses/Orbits: Mucosal thickening and opacification of scattered ethmoid air cells. No air-fluid levels. Mastoid air cells are clear. Orbital soft tissues unremarkable. Other: None IMPRESSION: No acute intracranial abnormality. Atrophy, chronic microvascular disease. Electronically Signed   By: Charlett Nose M.D.   On: 03/28/2016 14:38   Ct Angio Chest Pe W Or Wo Contrast  Result Date: 03/28/2016 CLINICAL DATA:  Weakness. Nausea, vomiting, dizziness, and not feeling well for the past 2 days. Increased confusion. EXAM: CT ANGIOGRAPHY CHEST WITH CONTRAST TECHNIQUE: Multidetector CT imaging of the chest was performed using the standard protocol during bolus administration of intravenous contrast. Multiplanar CT image reconstructions and MIPs were obtained to evaluate the vascular anatomy. CONTRAST:  75 mL Isovue 370 COMPARISON:  Chest and abdominal radiographs 03/27/2016 FINDINGS: Cardiovascular: Pulmonary arterial opacification is adequate without evidence of emboli. Subsegmental assessment is limited by motion artifact. Thoracic aortic atherosclerosis and mild ectasia. Right-sided cardiac chamber enlargement. Coronary artery atherosclerosis. No pericardial effusion. Mediastinum/Nodes: No enlarged axillary lymph nodes. A few borderline enlarged right hilar and paratracheal lymph nodes measure up to 1.1 cm in short axis. Unremarkable thyroid and trachea. Moderate-sized sliding hiatal hernia. Lungs/Pleura: Trace pleural effusions. Mild patchy ground-glass opacities in the left greater than right lower lobes. Upper Abdomen: Small stones in the gallbladder. Musculoskeletal: Suspected 1.7 cm intraarticular loose body in the subcoracoid recess of the right shoulder joint. Old posterior rib fractures bilaterally. Vertebral compression fractures at C7, T2, T3, T6, T7, T11, and L1. Height loss is severe at T11 and L1 and moderately severe at T7. Review of the MIP images confirms the above findings.  IMPRESSION: 1. No evidence of pulmonary emboli. 2. Mild left greater than right lower lobe ground-glass opacities which may reflect atypical infection. 3. Trace pleural effusions. 4. Moderate-sized hiatal hernia. 5. Aortic atherosclerosis. 6. Numerous spinal compression fractures of indeterminate age. Electronically Signed   By: Sebastian Ache M.D.   On: 03/28/2016 14:53   Dg Abd Acute W/chest  Result Date: 03/27/2016 CLINICAL DATA:  Dehydration and dizziness EXAM: DG ABDOMEN ACUTE W/ 1V CHEST COMPARISON:  November 07, 2012 chest radiograph FINDINGS: PA chest: There is no edema or consolidation. Heart is mildly enlarged with pulmonary vascularity within normal limits. Aorta is tortuous with atherosclerotic calcification in the aorta. There is degenerative change in each shoulder. Supine and upright abdomen: There is moderate stool throughout the colon. There is no bowel dilatation or air-fluid level suggesting bowel obstruction. No free air. Bones are osteoporotic. There is marked collapse of the L1 vertebral body. IMPRESSION: No bowel obstruction or free air. No lung edema or consolidation. Aortic  atherosclerosis present. Mild cardiomegaly. Marked collapse of the L1 vertebral body. Electronically Signed   By: Bretta BangWilliam  Woodruff III M.D.   On: 03/27/2016 18:32     Assessment and Plan  Principal Problem:   Hyponatremia Active Problems:   Alzheimer's dementia   Nausea and vomiting   Atrial flutter (HCC)    1. Newly diagnosed atrial flutter with RVR: -Rates are reasonably well controlled into the 90's to low 100's bpm -Asymptomatic -Will change metoprolol to short-acting diltiazem 30 mg q 6 hours today given he continues to have wheezing on exam as well as noted hoarseness  -Not on full-dose anticoagulation given his dementia as well as frequent falls -Continue ASA -Unlikely to be a candidate for atrial flutter ablation as above -CHADS2VASc at least 2 (age x 2) -He is aware of increased stroke  risk  2. Demand ischemia: -No symptoms concerning for ischemia -His minimally elevated and flat trending troponin is likely 2/2 atrial flutter with RVR -No need to continue to cycle any further in this asymptomatic patient with dementia and high fall risk  3. Nausea/vomiting/hyponatremia: -Improving -Per IM  4. Dementia: -Sitter in room -Per IM  5. Frequent falls: -Sitter in room -Needs PT eval  6. Valvular heart disease: -Uncertain etiology at this time given workup thus far -Continue to monitor   Signed, Eula Listenyan Alvis Edgell, PA-C [facility] Pager: [phone] 03/29/2016, 7:45 AM

## 2016-03-29 NOTE — Progress Notes (Signed)
[facility] [place] [facility]   PATIENT NAME: Johnathan RancherCarl Derusha    MR#:  [id number]  DATE OF BIRTH:  01-01-1925  SUBJECTIVE:  CHIEF COMPLAINT:   Chief Complaint  Patient presents with  . Emesis  . Near Syncope   The patient is 82 year old Caucasian male with past medical history significant for history of dementia, BPH, who presents to the hospital with complaints of not feeling well, having nauseated and vomiting, having dizziness for the past 2 days. On arrival to the hospital. Patient's sodium level was found to be 118, he was admitted to the hospital for IV fluid administration. Due to concerns of dehydration. His sodium level has improved to 134 today. Patient is still confused, denies any discomfort, review of system is not available due to patient's confusion     Review of Systems  Constitutional: Negative for chills, fever and weight loss.  HENT: Negative for congestion.   Eyes: Negative for blurred vision and double vision.  Respiratory: Negative for cough, sputum production, shortness of breath and wheezing.   Cardiovascular: Negative for chest pain, palpitations, orthopnea, leg swelling and PND.  Gastrointestinal: Negative for abdominal pain, blood in stool, constipation, diarrhea, nausea and vomiting.  Genitourinary: Negative for dysuria, frequency, hematuria and urgency.  Musculoskeletal: Negative for falls.  Neurological: Negative for dizziness, tremors, focal weakness and headaches.  Endo/Heme/Allergies: Does not bruise/bleed easily.  Psychiatric/Behavioral: Negative for depression. The patient does not have insomnia.     VITAL SIGNS: Blood pressure 131/77, pulse (!) 104, temperature 98.1 F (36.7 C), temperature source Oral, resp. rate 20, height 5\' 8"  (1.727 m), weight 70.3 kg (155 lb), SpO2 100 %.  PHYSICAL EXAMINATION:   GENERAL:  82 y.o.-year-old patient lying in the bed with no acute distress, confused, but answers some questions  appropriately Comfortable, talkative, very alert today.  EYES: Pupils equal, round, reactive to light and accommodation. No scleral icterus. Extraocular muscles intact.  HEENT: Head atraumatic, normocephalic. Oropharynx and nasopharynx clear.  NECK:  Supple, no jugular venous distention. No thyroid enlargement, no tenderness.  LUNGS: Normal breath sounds bilaterally,some expiratory wheezing, no rales,rhonchi or crepitation. No use of accessory muscles of respiration.  CARDIOVASCULAR: S1, S2 , rhythm is regular. 2 out of 6 systolic murmur was heard in all precordium, no rubs, or gallops.  ABDOMEN: Soft, nontender, nondistended. Bowel sounds present. No organomegaly or mass.  EXTREMITIES: No pedal edema, cyanosis, or clubbing.  NEUROLOGIC: Cranial nerves II through XII are intact. Muscle strength 5/5 in all extremities. Sensation intact. Gait not checked.  PSYCHIATRIC: The patient is alert and oriented x 1.  SKIN: No obvious rash, lesion, or ulcer.   ORDERS/RESULTS REVIEWED:   CBC  Recent Labs Lab 03/27/16 1559 03/28/16 0440  WBC 6.9 7.5  HGB 13.8 13.2  HCT 39.4* 37.5*  PLT 185 177  MCV 99.5 100.1*  MCH 34.8* 35.2*  MCHC 35.0 35.2  RDW 13.5 13.7   ------------------------------------------------------------------------------------------------------------------  Chemistries   Recent Labs Lab 03/27/16 1559 03/27/16 2213 03/28/16 0440 03/28/16 1319 03/29/16 0110  NA 118* 128* 129*  --  134*  K 3.6 3.9 3.9  --   --   CL 83* 95* 97*  --   --   CO2 26 25 26   --   --   GLUCOSE 92 85 86  --   --   BUN 17 18 18   --   --   CREATININE 1.02 0.95 1.02  --   --   CALCIUM 8.7*  8.6* 8.4*  --   --   MG  --   --   --  2.0  --   AST 59*  --   --   --   --   ALT 30  --   --   --   --   ALKPHOS 59  --   --   --   --   BILITOT 0.9  --   --   --   --    ------------------------------------------------------------------------------------------------------------------ estimated creatinine  clearance is 47.5 mL/min (by C-G formula based on SCr of 1.02 mg/dL). ------------------------------------------------------------------------------------------------------------------ No results for input(s): TSH, T4TOTAL, T3FREE, THYROIDAB in the last 72 hours.  Invalid input(s): FREET3  Cardiac Enzymes  Recent Labs Lab 03/28/16 1848 03/29/16 0110 03/29/16 0712  TROPONINI 0.04* 0.05* 0.03*   ------------------------------------------------------------------------------------------------------------------ Invalid input(s): POCBNP ---------------------------------------------------------------------------------------------------------------  RADIOLOGY: Ct Head Wo Contrast  Result Date: 03/28/2016 CLINICAL DATA:  Confusion. EXAM: CT HEAD WITHOUT CONTRAST TECHNIQUE: Contiguous axial images were obtained from the base of the skull through the vertex without intravenous contrast. COMPARISON:  None. FINDINGS: Brain: There is atrophy and chronic small vessel disease changes. Associated ventriculomegaly, likely related to ex vacuo dilatation. No acute intracranial abnormality. Specifically, no hemorrhage, hydrocephalus, mass lesion, acute infarction, or significant intracranial injury. Vascular: No hyperdense vessel or unexpected calcification. Skull: No acute calvarial abnormality. Sinuses/Orbits: Mucosal thickening and opacification of scattered ethmoid air cells. No air-fluid levels. Mastoid air cells are clear. Orbital soft tissues unremarkable. Other: None IMPRESSION: No acute intracranial abnormality. Atrophy, chronic microvascular disease. Electronically Signed   By: Charlett NoseKevin  Dover M.D.   On: 03/28/2016 14:38   Ct Angio Chest Pe W Or Wo Contrast  Result Date: 03/28/2016 CLINICAL DATA:  Weakness. Nausea, vomiting, dizziness, and not feeling well for the past 2 days. Increased confusion. EXAM: CT ANGIOGRAPHY CHEST WITH CONTRAST TECHNIQUE: Multidetector CT imaging of the chest was performed  using the standard protocol during bolus administration of intravenous contrast. Multiplanar CT image reconstructions and MIPs were obtained to evaluate the vascular anatomy. CONTRAST:  75 mL Isovue 370 COMPARISON:  Chest and abdominal radiographs 03/27/2016 FINDINGS: Cardiovascular: Pulmonary arterial opacification is adequate without evidence of emboli. Subsegmental assessment is limited by motion artifact. Thoracic aortic atherosclerosis and mild ectasia. Right-sided cardiac chamber enlargement. Coronary artery atherosclerosis. No pericardial effusion. Mediastinum/Nodes: No enlarged axillary lymph nodes. A few borderline enlarged right hilar and paratracheal lymph nodes measure up to 1.1 cm in short axis. Unremarkable thyroid and trachea. Moderate-sized sliding hiatal hernia. Lungs/Pleura: Trace pleural effusions. Mild patchy ground-glass opacities in the left greater than right lower lobes. Upper Abdomen: Small stones in the gallbladder. Musculoskeletal: Suspected 1.7 cm intraarticular loose body in the subcoracoid recess of the right shoulder joint. Old posterior rib fractures bilaterally. Vertebral compression fractures at C7, T2, T3, T6, T7, T11, and L1. Height loss is severe at T11 and L1 and moderately severe at T7. Review of the MIP images confirms the above findings. IMPRESSION: 1. No evidence of pulmonary emboli. 2. Mild left greater than right lower lobe ground-glass opacities which may reflect atypical infection. 3. Trace pleural effusions. 4. Moderate-sized hiatal hernia. 5. Aortic atherosclerosis. 6. Numerous spinal compression fractures of indeterminate age. Electronically Signed   By: Sebastian AcheAllen  Grady M.D.   On: 03/28/2016 14:53   Dg Abd Acute W/chest  Result Date: 03/27/2016 CLINICAL DATA:  Dehydration and dizziness EXAM: DG ABDOMEN ACUTE W/ 1V CHEST COMPARISON:  November 07, 2012 chest radiograph FINDINGS: PA chest: There is no edema or  consolidation. Heart is mildly enlarged with pulmonary  vascularity within normal limits. Aorta is tortuous with atherosclerotic calcification in the aorta. There is degenerative change in each shoulder. Supine and upright abdomen: There is moderate stool throughout the colon. There is no bowel dilatation or air-fluid level suggesting bowel obstruction. No free air. Bones are osteoporotic. There is marked collapse of the L1 vertebral body. IMPRESSION: No bowel obstruction or free air. No lung edema or consolidation. Aortic atherosclerosis present. Mild cardiomegaly. Marked collapse of the L1 vertebral body. Electronically Signed   By: Bretta Bang III M.D.   On: 03/27/2016 18:32    EKG:  Orders placed or performed during the hospital encounter of 03/27/16  . ED EKG  . ED EKG  . EKG 12-Lead  . EKG 12-Lead    ASSESSMENT AND PLAN:  Principal Problem:   Hyponatremia Active Problems:   Alzheimer's dementia   Nausea and vomiting   Atrial flutter (HCC)  #1. Hyponatremia due to dehydration, continue IV fluids, follow sodium level in the morning, sodium has improved to 134  today #2. Dehydration due to nausea and vomiting, oral intake was reported as fair  #3. Constipation,continue MiraLAX and Colace. Last bowel movement 31st of October 2017  #4. Nausea, vomiting, possibly related to constipation, supportive therapy, IV fluids, Oral intake was 100% today in the morning  #5 atrial flutter,, now off metoprolol due to wheezing, initiated on Cardizem, as per cardiologist recommendations, echo revealed normal ejection fraction, mild-to-moderate aortic insufficiency, moderate to severe tricuspid regurgitation, dilated right side of the heart, CTA of the chest showed no  pulmonary embolism, but possibly right lower lobe opacities, likely infection, questionable aspiration pneumonitis, speech therapist evaluated patient and recommended to crush pills, continue dysphagia 3 diet with thin liquids, discussed with patient's daughter extensively. The patient is  not a candidate for anticoagulation, he is to follow up with cardiologist within a few weeks after discharge, possible direct current cardioversion and 1 months of anticoagulation, per cardiologist.  #6. Pyuria, suspected urinary tract infection, now on the levofloxacin, urinary cultures are pending, patient admits of some cough and phlegm production, no sputum was taken  Management plans discussed with the patient, family and they are in agreement.   DRUG ALLERGIES: No Known Allergies  CODE STATUS:     Code Status Orders        Start     Ordered   03/27/16 1741  Full code  Continuous     03/27/16 1740    Code Status History    Date Active Date Inactive Code Status Order ID Comments User Context   06/22/2015  7:16 PM 06/23/2015  2:42 PM Full Code [id number]  Christena Flake, MD Inpatient      TOTAL TIME TAKING CARE OF THIS PATIENT: 45 minutes.  Discussed this patient's Daughter, Trudell, all questions were answered, she voiced understanding   Geryl Dohn M.D on 03/29/2016 at 12:24 PM  Between 7am to 6pm - Pager - [phone]  After 6pm go to [email or web address] - password [email or web address] [facility]  [facility]  Office  [phone]  CC: Primary care physician; No primary care provider on file.

## 2016-03-29 NOTE — Evaluation (Signed)
Clinical/Bedside Swallow Evaluation Patient Details  Name: Johnathan Foster MRN: [id number] Date of Birth: Dec 04, 1924  Today's Date: 03/29/2016 Time: SLP Start Time (ACUTE ONLY): 1230 SLP Stop Time (ACUTE ONLY): 1330 SLP Time Calculation (min) (ACUTE ONLY): 60 min  Past Medical History:  Past Medical History:  Diagnosis Date  . Abnormality of gait 12/24/2014  . Alzheimer's dementia   . BPH (benign prostatic hyperplasia)   . Fatigue   . Lower urinary tract symptoms   . Memory deficits 12/24/2014  . Memory loss    Past Surgical History:  Past Surgical History:  Procedure Laterality Date  . BONY PELVIS SURGERY    . FOOT SURGERY    . ORIF ELBOW FRACTURE Left 06/22/2015   Procedure: OPEN REDUCTION INTERNAL FIXATION (ORIF) ELBOW/OLECRANON FRACTURE;  Surgeon: Christena FlakeJohn J Poggi, MD;  Location: [facility] ORS;  Service: Orthopedics;  Laterality: Left;   HPI:  Pt is 82 year old Caucasian male with past medical history significant for Alzheimer's dementia, BPH, urinary tract infection who presents to the hospital with complaints of not feeling well, having nauseated and vomiting, having dizziness for the past 2 days. On arrival to the hospital. Patient's sodium level was found to be 118, he was admitted to the hospital for IV fluid administration. Due to concerns of dehydration. His sodium level has improved to 134 today. Upon admission, pt also presented with dehydration, dizziness. Patient stated that he has been Vomiting for the last 3 days. He was unable to keep anything down including water. Denies any abdominal pain or chest pain. Patient has been feeling lightheaded and dizzy but didn't pass out. Has some sore throat since yesterday and hoarseness of his voice (suspect this could be related to the recent vomiting) but denies trouble swallowing. Pt is on a regular diet currently.   Assessment / Plan / Recommendation Clinical Impression  Pt appeared to adequately tolerate trials of thin liquids, purees  and soft solids(well-cut meats) w/ no overt s/s of aspiration noted; clear vocal quality b/t trials when assessed, no immediate coughing post trials. Oral phase c/b min increased mastication time w/ increase texture of meats not as chopped; pt tended to use sips of liquids w/ his food trials to aid bolus during mastication. Noted a much delayed cough x1 when pt changed the consistency of food to mixed consistency. With other trials and during sips of liquids alone, no other overt s/s of aspiration were noted. Pt fed self w/ tray setup and support. Pt appears at reduced risk for aspiration following general aspiration precautions. If any increased coughing noted during meals when pt is mixing foods and liquids orally, then would strongly suggest reducing mixed consistency foods and small sips/bites; moisten foods. Due to pt having difficulty swallowing larger pills this admission per NSG, recommend ALL MEDICATIONS BE GIVEN IN PUREE - CRUSHED AS ABLE for safer, easier swallowing. Pt would benefit from a Dysphagia 3 diet w/ thin liquids; general aspiration precautions. ST will be available for further education if needed while admitted. Pt and Grandson agreed.    Aspiration Risk   (reduced following precautions)    Diet Recommendation  Dysphagia 3 w/ thin liquids; general aspiration precautions; setup assistance  Medication Administration: Crushed with puree    Other  Recommendations Recommended Consults:  (none) Oral Care Recommendations: Oral care BID;Staff/trained caregiver to provide oral care   Follow up Recommendations None      Frequency and Duration min 2x/week  1 week       Prognosis Prognosis for Safe  Diet Advancement: Good Barriers to Reach Goals: Cognitive deficits      Swallow Study   General Date of Onset: 03/27/16 HPI: Pt is 82 year old Caucasian male with past medical history significant for Alzheimer's dementia, BPH, urinary tract infection who presents to the hospital with  complaints of not feeling well, having nauseated and vomiting, having dizziness for the past 2 days. On arrival to the hospital. Patient's sodium level was found to be 118, he was admitted to the hospital for IV fluid administration. Due to concerns of dehydration. His sodium level has improved to 134 today. Upon admission, pt also presented with dehydration, dizziness. Patient stated that he has been Vomiting for the last 3 days. He was unable to keep anything down including water. Denies any abdominal pain or chest pain. Patient has been feeling lightheaded and dizzy but didn't pass out. Has some sore throat since yesterday and hoarseness of his voice (suspect this could be related to the recent vomiting) but denies trouble swallowing. Pt is on a regular diet currently. Type of Study: Bedside Swallow Evaluation Previous Swallow Assessment: none Diet Prior to this Study: Regular;Thin liquids Temperature Spikes Noted: No Respiratory Status: Room air History of Recent Intubation: No Behavior/Cognition: Alert;Cooperative;Pleasant mood;Confused;Distractible;Requires cueing Oral Cavity Assessment: Within Functional Limits Oral Care Completed by SLP: Recent completion by staff Oral Cavity - Dentition: Dentures, top;Dentures, bottom Vision: Functional for self-feeding Self-Feeding Abilities: Able to feed self;Needs set up Patient Positioning: Upright in bed (sitting EOB) Baseline Vocal Quality: Normal Volitional Cough: Strong;Congested (recent vomiting) Volitional Swallow: Able to elicit    Oral/Motor/Sensory Function Overall Oral Motor/Sensory Function: Within functional limits   Ice Chips Ice chips: Not tested   Thin Liquid Thin Liquid: Within functional limits Presentation: Self Fed;Cup;Straw (~4 ozs) Other Comments: tended to mix liquids w/ food in mouth    Nectar Thick Nectar Thick Liquid: Not tested   Honey Thick Honey Thick Liquid: Not tested   Puree Puree: Within functional  limits Presentation: Self Fed;Spoon (5+ boluses)   Solid   GO   Solid: Impaired Presentation: Self Fed;Spoon (5+ trials) Oral Phase Impairments: Impaired mastication (increased mastication time w/ meats) Oral Phase Functional Implications: Impaired mastication Pharyngeal Phase Impairments:  (none) Other Comments: a delayed cough post trials after pt added sips of milk to mouthful of food - mixed consistency (x1)         Jerilynn SomKatherine Kelia Gibbon, MS, CCC-SLP  Ashlin Kreps 03/29/2016,2:38 PM

## 2016-03-29 NOTE — Plan of Care (Signed)
Problem: SLP Dysphagia Goals Goal: Misc Dysphagia Goal Pt will safely tolerate po diet of least restrictive consistency w/ no overt s/s of aspiration noted by Staff/pt/family x3 sessions.    

## 2016-03-30 DIAGNOSIS — R1310 Dysphagia, unspecified: Secondary | ICD-10-CM

## 2016-03-30 DIAGNOSIS — K5900 Constipation, unspecified: Secondary | ICD-10-CM

## 2016-03-30 DIAGNOSIS — N390 Urinary tract infection, site not specified: Secondary | ICD-10-CM

## 2016-03-30 DIAGNOSIS — J189 Pneumonia, unspecified organism: Secondary | ICD-10-CM

## 2016-03-30 LAB — CREATININE, SERUM
CREATININE: 0.97 mg/dL (ref 0.61–1.24)
GFR calc Af Amer: >60 mL/min (ref >60)
GFR calc non Af Amer: >60 mL/min (ref >60)

## 2016-03-30 LAB — TSH: TSH: 3.287 u[IU]/mL (ref 0.350–4.500)

## 2016-03-30 LAB — OSMOLALITY, URINE: Osmolality, Ur: 279 mOsm/kg — ABNORMAL LOW (ref 300–900)

## 2016-03-30 LAB — CORTISOL: Cortisol, Plasma: 16 ug/dL

## 2016-03-30 LAB — SODIUM: SODIUM: 133 mmol/L — AB (ref 135–145)

## 2016-03-30 LAB — TROPONIN I
TROPONIN I: 0.04 ng/mL — AB (ref <0.03)
TROPONIN I: 0.04 ng/mL — AB (ref <0.03)

## 2016-03-30 MED ORDER — POLYETHYLENE GLYCOL 3350 17 G PO PACK: 17.0000 g | PACK | Freq: Every day | ORAL | 0 refills | Status: DC

## 2016-03-30 MED ORDER — DILTIAZEM HCL 30 MG PO TABS
60.0000 mg | ORAL_TABLET | Freq: Three times a day (TID) | ORAL | Status: DC
  Administered 2016-03-30: 60 mg via ORAL
  Filled 2016-03-30: qty 2

## 2016-03-30 MED ORDER — ENSURE ENLIVE PO LIQD: 237.0000 mL | Freq: Two times a day (BID) | ORAL | 12 refills | Status: AC

## 2016-03-30 MED ORDER — DOCUSATE SODIUM 100 MG PO CAPS: 100.0000 mg | ORAL_CAPSULE | Freq: Two times a day (BID) | ORAL | 0 refills | Status: DC

## 2016-03-30 MED ORDER — ONDANSETRON HCL 4 MG PO TABS: 4.0000 mg | ORAL_TABLET | Freq: Three times a day (TID) | ORAL | 0 refills | Status: DC | PRN

## 2016-03-30 MED ORDER — DILTIAZEM HCL ER COATED BEADS 120 MG PO CP24: 120.0000 mg | ORAL_CAPSULE | Freq: Every day | ORAL | 5 refills | Status: AC

## 2016-03-30 MED ORDER — ONDANSETRON HCL 4 MG/2ML IJ SOLN: 4.0000 mg | Freq: Four times a day (QID) | INTRAMUSCULAR | 0 refills | Status: DC | PRN

## 2016-03-30 MED ORDER — LEVOFLOXACIN 500 MG PO TABS: 500.0000 mg | ORAL_TABLET | Freq: Every day | ORAL | 0 refills | Status: DC

## 2016-03-30 MED ORDER — CEPASTAT 14.5 MG MT LOZG: 1.0000 | LOZENGE | OROMUCOSAL | 0 refills | Status: DC | PRN

## 2016-03-30 NOTE — Discharge Summary (Addendum)
[facility] [place] [facility]   PATIENT NAME: Johnathan Foster    MR#:  [id number]  DATE OF BIRTH:  12-Nov-1924  DATE OF ADMISSION:  03/27/2016 ADMITTING PHYSICIAN: Alford Highland, MD  DATE OF DISCHARGE: 03/30/2016  2:30 PM  PRIMARY CARE PHYSICIAN: [facility]     ADMISSION DIAGNOSIS:  Cough [R05] Vomiting [R11.10]  DISCHARGE DIAGNOSIS:  Principal Problem:   Hyponatremia Active Problems:   Alzheimer's dementia   Nausea and vomiting   Atrial flutter (HCC)   UTI (urinary tract infection)   Pneumonia   Constipation   Dysphagia   SECONDARY DIAGNOSIS:   Past Medical History:  Diagnosis Date  . Abnormality of gait 12/24/2014  . Alzheimer's dementia   . BPH (benign prostatic hyperplasia)   . Fatigue   . Lower urinary tract symptoms   . Memory deficits 12/24/2014  . Memory loss     .pro HOSPITAL COURSE:   The patient is 82 year old Caucasian male with past medical history significant for history of dementia, BPH, who presents to the hospital with complaints of not feeling well, having nauseated and vomiting, having dizziness for a few days. On arrival to the hospital the patient's sodium level was found to be 118, he was admitted to the hospital for IV fluid administration due to concerns of dehydration. His sodium level has improved to 133 by the day of discharge. Patient's EKG revealed atrial flutter, rate between 80s to 100s . Patient was seen by cardiologist, who initiated patient on Cardizem, metoprolol was stopped due to wheezing. Patient was advised to continue aspirin therapy, follow-up with cardiology as outpatient . Since patient's cardiac enzymes were mildly elevated, and echocardiogram was performed, revealing normal left ventricular ejection fraction, no wall motion abnormalities, mild to moderate aortic valve regurgitation, moderate to severe tricuspid valve regurgitation, severely dilated right ventricle and right  atrium. Due to concerns of chronic pulmonary embolism, patient underwent CT angiogram of the chest showing no evidence of pulmonary embolism, left greater than right lower lobe groundglass opacity, trace pleural effusions, moderate size hiatal hernia,  numerous spinal compression fractures of indeterminant age were noted. Due to concerns of atypical pneumonia, patient was initiated on levofloxacin. Moreover urinalysis was remarkable for pyuria, concerning for urinary tract infection, urinary culture was taken, pending, showing more than 100,000 colony-forming units of Escherichia coli, sensitivities are being repeated. Patient is being discharged home on levofloxacin, to be continued for 6 more days to complete course. Cortisol level, TSH were normal, hemoglobin A1c was 5.2. The patient was seen by speech therapist and recommended dysphagia 3 diet with thin liquids. The patient was seen by physical therapist, who recommended home health physical therapy Discussion by problem: #1. Hyponatremia due to dehydration, improved to the patient's baseline with IV fluids, follow sodium level as outpatient, liberalize salt intake. Urine osmolarity was low at 279, repeated was the same. TSH, as well as cortisol levels were normal.  #2. Dehydration due to nausea and vomiting, oral intake improved, patient is consuming 25-100% of offered to meals #3. Constipation,continue MiraLAX and Colace. Last bowel movement first of November 2017 #4. Nausea, vomiting, possibly related to constipation, resolved with supportive therapy, IV fluids, Oral intake is fluctuating between 25 100% of offered meals #5 atrial flutter, now off metoprolol due to wheezing, initiated on Cardizem CD at 120 mg daily dose, is recommended to advance Cardizem dose as needed following patient's heart rate closely, as per cardiologist recommendations, echo revealed normal ejection fraction, mild-to-moderate aortic insufficiency,  moderate to severe tricuspid  regurgitation, dilated right side of the heart, CTA of the chest showed no  pulmonary embolism, but possibly right lower lobe opacities, likely infection, questionable aspiration pneumonitis, speech therapist evaluated patient and recommended to crush pills, continue dysphagia 3 diet with thin liquids, discussed with patient's daughter. The patient is not a candidate for anticoagulation, he is to follow up with cardiologist within a few weeks after discharge, possible direct current cardioversion and 1 month of anticoagulation, per cardiologist.  cardiologist also does not feel that patient will be a candidate for atrial flutter ablation . Continue aspirin therapy.  #6. Pyuria, suspected urinary tract infec. Urinary cultures are pending, continue levofloxacin #7. Pneumonia? Groundglass opacities are seen in the left greater than the right lower lobe area , continue levofloxacin, follow clinically   STABLE  CONSULTS OBTAINED:  Treatment Team:  Yvonne Kendallhristopher End, MD  DRUG ALLERGIES:  No Known Allergies  DISCHARGE MEDICATIONS:   Discharge Medication List as of 03/30/2016  2:02 PM    START taking these medications   Details  diltiazem (CARDIZEM CD) 120 MG 24 hr capsule Take 1 capsule (120 mg total) by mouth daily., Starting Thu 03/30/2016, Normal    docusate sodium (COLACE) 100 MG capsule Take 1 capsule (100 mg total) by mouth 2 (two) times daily., Starting Thu 03/30/2016, Normal    feeding supplement, ENSURE ENLIVE, (ENSURE ENLIVE) LIQD Take 237 mLs by mouth 2 (two) times daily between meals., Starting Thu 03/30/2016, Normal    levofloxacin (LEVAQUIN) 500 MG tablet Take 1 tablet (500 mg total) by mouth daily., Starting Thu 03/30/2016, Normal    phenol-menthol (CEPASTAT) 14.5 MG lozenge Place 1 lozenge inside cheek as needed for sore throat., Starting Thu 03/30/2016, Normal    polyethylene glycol (MIRALAX / GLYCOLAX) packet Take 17 g by mouth daily., Starting Thu 03/30/2016, Normal    ondansetron  (ZOFRAN) 4 MG/2ML SOLN injection Inject 2 mLs (4 mg total) into the vein every 6 (six) hours as needed for nausea or vomiting., Starting Thu 03/30/2016, Normal      CONTINUE these medications which have NOT CHANGED   Details  aspirin 325 MG tablet Take 325 mg by mouth daily., Historical Med    Calcium Carbonate-Vitamin D (CALCIUM-VITAMIN D) 600-125 MG-UNIT TABS Take 1 tablet by mouth daily., Historical Med         TThe patient is to follow-up with primary care physician, cardiologist, Dr. and of [facility] as outpatient he patient is to follow-up with primary care physician, cardiologist, Dr. and as outpatient   If you experience worsening of your admission symptoms, develop shortness of breath, life threatening emergency, suicidal or homicidal thoughts you must seek medical attention immediately by calling 911 or calling your MD immediately  if symptoms less severe.  You Must read complete instructions/literature along with all the possible adverse reactions/side effects for all the Medicines you take and that have been prescribed to you. Take any new Medicines after you have completely understood and accept all the possible adverse reactions/side effects.   Please note  You were cared for by a hospitalist during your hospital stay. If you have any questions about your discharge medications or the care you received while you were in the hospital after you are discharged, you can call the unit and asked to speak with the hospitalist on call if the hospitalist that took care of you is not available. Once you are discharged, your primary care physician will handle any further medical issues. Please note  that NO REFILLS for any discharge medications will be authorized once you are discharged, as it is imperative that you return to your primary care physician (or establish a relationship with a primary care physician if you do not have one) for your aftercare needs so that they can reassess  your need for medications and monitor your lab values.    Today   CHIEF COMPLAINT:   Chief Complaint  Patient presents with  . Emesis  . Near Syncope    HISTORY OF PRESENT ILLNESS:  Amal Saiki  is a 82 y.o. male with a known history of dementia, BPH, who presents to the hospital with complaints of not feeling well, having nauseated and vomiting, having dizziness for a few days. On arrival to the hospital the patient's sodium level was found to be 118, he was admitted to the hospital for IV fluid administration due to concerns of dehydration. His sodium level has improved to 133 by the day of discharge. Patient's EKG revealed atrial flutter, rate between 80s to 100s . Patient was seen by cardiologist, who initiated patient on Cardizem, metoprolol was stopped due to wheezing. Patient was advised to continue aspirin therapy, follow-up with cardiology as outpatient . Since patient's cardiac enzymes were mildly elevated, and echocardiogram was performed, revealing normal left ventricular ejection fraction, no wall motion abnormalities, mild to moderate aortic valve regurgitation, moderate to severe tricuspid valve regurgitation, severely dilated right ventricle and right atrium. Due to concerns of chronic pulmonary embolism, patient underwent CT angiogram of the chest showing no evidence of pulmonary embolism, left greater than right lower lobe groundglass opacity, trace pleural effusions, moderate size hiatal hernia,  numerous spinal compression fractures of indeterminant age were noted. Due to concerns of atypical pneumonia, patient was initiated on levofloxacin. Moreover urinalysis was remarkable for pyuria, concerning for urinary tract infection, urinary culture was taken, pending, showing more than 100,000 colony-forming units of Escherichia coli, sensitivities are being repeated. Patient is being discharged home on levofloxacin, to be continued for 6 more days to complete course. Cortisol level,  TSH were normal, hemoglobin A1c was 5.2. The patient was seen by speech therapist and recommended dysphagia 3 diet with thin liquids. The patient was seen by physical therapist, who recommended home health physical therapy Discussion by problem: #1. Hyponatremia due to dehydration, improved to the patient's baseline with IV fluids, follow sodium level as outpatient, liberalize salt intake. Urine osmolarity was low at 279, repeated was the same. TSH, as well as cortisol levels were normal.  #2. Dehydration due to nausea and vomiting, oral intake improved, patient is consuming 25-100% of offered to meals #3. Constipation,continue MiraLAX and Colace. Last bowel movement first of November 2017 #4. Nausea, vomiting, possibly related to constipation, resolved with supportive therapy, IV fluids, Oral intake is fluctuating between 25 100% of offered meals #5 atrial flutter, now off metoprolol due to wheezing, initiated on Cardizem CD at 120 mg daily dose, is recommended to advance Cardizem dose as needed following patient's heart rate closely, as per cardiologist recommendations, echo revealed normal ejection fraction, mild-to-moderate aortic insufficiency, moderate to severe tricuspid regurgitation, dilated right side of the heart, CTA of the chest showed no  pulmonary embolism, but possibly right lower lobe opacities, likely infection, questionable aspiration pneumonitis, speech therapist evaluated patient and recommended to crush pills, continue dysphagia 3 diet with thin liquids, discussed with patient's daughter. The patient is not a candidate for anticoagulation, he is to follow up with cardiologist within a few weeks after discharge, possible  direct current cardioversion and 1 month of anticoagulation, per cardiologist.  cardiologist also does not feel that patient will be a candidate for atrial flutter ablation . Continue aspirin therapy.  #6. Pyuria, suspected urinary tract infec. Urinary cultures are  pending, continue levofloxacin #7. Pneumonia? Groundglass opacities are seen in the left greater than the right lower lobe area , continue levofloxacin, follow clinically     VITAL SIGNS:  Blood pressure 120/69, pulse 81, temperature 97.9 F (36.6 C), temperature source Oral, resp. rate 20, height 5\' 8"  (1.727 m), weight 70.3 kg (155 lb), SpO2 99 %.  I/O:    Intake/Output Summary (Last 24 hours) at 03/30/16 1542 Last data filed at 03/30/16 1200  Gross per 24 hour  Intake          1839.17 ml  Output             2327 ml  Net          -487.83 ml    PHYSICAL EXAMINATION:  GENERAL:  82 y.o.-year-old patient lying in the bed with no acute distress.  EYES: Pupils equal, round, reactive to light and accommodation. No scleral icterus. Extraocular muscles intact.  HEENT: Head atraumatic, normocephalic. Oropharynx and nasopharynx clear.  NECK:  Supple, no jugular venous distention. No thyroid enlargement, no tenderness.  LUNGS: Normal breath sounds bilaterally, no wheezing, rales,rhonchi or crepitation. No use of accessory muscles of respiration.  CARDIOVASCULAR: S1, S2 normal. No murmurs, rubs, or gallops.  ABDOMEN: Soft, non-tender, non-distended. Bowel sounds present. No organomegaly or mass.  EXTREMITIES: No pedal edema, cyanosis, or clubbing.  NEUROLOGIC: Cranial nerves II through XII are intact. Muscle strength 5/5 in all extremities. Sensation intact. Gait not checked.  PSYCHIATRIC: The patient is alert and oriented x 3.  SKIN: No obvious rash, lesion, or ulcer.   DATA REVIEW:   CBC  Recent Labs Lab 03/28/16 0440  WBC 7.5  HGB 13.2  HCT 37.5*  PLT 177    Chemistries   Recent Labs Lab 03/27/16 1559  03/28/16 0440 03/28/16 1319  03/30/16 0120 03/30/16 0753  NA 118*  < > 129*  --   < > 133*  --   K 3.6  < > 3.9  --   --   --   --   CL 83*  < > 97*  --   --   --   --   CO2 26  < > 26  --   --   --   --   GLUCOSE 92  < > 86  --   --   --   --   BUN 17  < > 18  --    --   --   --   CREATININE 1.02  < > 1.02  --   --   --  0.97  CALCIUM 8.7*  < > 8.4*  --   --   --   --   MG  --   --   --  2.0  --   --   --   AST 59*  --   --   --   --   --   --   ALT 30  --   --   --   --   --   --   ALKPHOS 59  --   --   --   --   --   --   BILITOT 0.9  --   --   --   --   --   --   < > =  values in this interval not displayed.  Cardiac Enzymes  Recent Labs Lab 03/30/16 0753  TROPONINI 0.04*    Microbiology Results  Results for orders placed or performed during the hospital encounter of 03/27/16  Urine culture     Status: Abnormal (Preliminary result)   Collection Time: 03/27/16  5:33 PM  Result Value Ref Range Status   Specimen Description URINE, CLEAN CATCH  Final   Special Requests Normal  Final   Culture (A)  Final    >=100,000 COLONIES/mL ESCHERICHIA COLI REPEATING SENSITIVITIES Performed at [facility]    Report Status PENDING  Incomplete    RADIOLOGY:  No results found.  EKG:   Orders placed or performed during the hospital encounter of 03/27/16  . ED EKG  . ED EKG  . EKG 12-Lead  . EKG 12-Lead      Management plans discussed with the patient, family and they are in agreement.  CODE STATUS:     Code Status Orders        Start     Ordered   03/27/16 1741  Full code  Continuous     03/27/16 1740    Code Status History    Date Active Date Inactive Code Status Order ID Comments User Context   06/22/2015  7:16 PM 06/23/2015  2:42 PM Full Code [id number]  Christena Flake, MD Inpatient      TOTAL TIME TAKING CARE OF THIS PATIENT: 40 minutes.    Katharina Caper M.D on 03/30/2016 at 3:42 PM  Between 7am to 6pm - Pager - [phone]  After 6pm go to [email or web address] - password [email or web address] [facility]  [facility]  Office  [phone]  CC: Primary care physician; [facility]

## 2016-03-30 NOTE — Progress Notes (Signed)
Patient: Johnathan Foster / Admit Date: 03/27/2016 / Date of Encounter: 03/30/2016, 8:57 AM   Subjective: Combative overnight. Sleeping this morning. Troponin checked x 10 and found to be not trending up with a peak of 0.05.   Review of Systems: Review of Systems  Unable to perform ROS: Dementia    Objective: Telemetry: atrial flutter with variable AV block, low 100's bpm Physical Exam: Blood pressure 120/69, pulse 81, temperature 97.9 F (36.6 C), temperature source Oral, resp. rate 20, height 5\' 8"  (1.727 m), weight 155 lb (70.3 kg), SpO2 99 %. Body mass index is 23.57 kg/m. General: Well developed, well nourished, in no acute distress. Head: Normocephalic, atraumatic, sclera non-icteric, no xanthomas, nares are without discharge. Neck: Negative for carotid bruits. JVP not elevated. Lungs: Bilateral wheezing. Breathing is unlabored. Heart: Tachycardic, irregular S1 S2 without murmurs, rubs, or gallops.  Abdomen: Soft, non-tender, non-distended with normoactive bowel sounds. No rebound/guarding. Extremities: No clubbing or cyanosis. No edema. Distal pedal pulses are 2+ and equal bilaterally. Neuro: Alert. Moves all extremities spontaneously. Psych:  Responds to questions.   Intake/Output Summary (Last 24 hours) at 03/30/16 0857 Last data filed at 03/30/16 0800  Gross per 24 hour  Intake          2279.17 ml  Output             2027 ml  Net           252.17 ml    Inpatient Medications:  . aspirin EC  81 mg Oral Daily  . calcium-vitamin D  1 tablet Oral Daily  . diltiazem  60 mg Oral Q8H  . docusate sodium  100 mg Oral BID  . enoxaparin (LOVENOX) injection  40 mg Subcutaneous Q24H  . feeding supplement (ENSURE ENLIVE)  237 mL Oral BID BM  . levofloxacin  500 mg Oral Daily  . polyethylene glycol  17 g Oral Daily   Infusions:    Labs:  Recent Labs  03/27/16 2213 03/28/16 0440 03/28/16 1319 03/29/16 0110 03/30/16 0120 03/30/16 0753  NA 128* 129*  --  134* 133*   --   K 3.9 3.9  --   --   --   --   CL 95* 97*  --   --   --   --   CO2 25 26  --   --   --   --   GLUCOSE 85 86  --   --   --   --   BUN 18 18  --   --   --   --   CREATININE 0.95 1.02  --   --   --  0.97  CALCIUM 8.6* 8.4*  --   --   --   --   MG  --   --  2.0  --   --   --     Recent Labs  03/27/16 1559  AST 59*  ALT 30  ALKPHOS 59  BILITOT 0.9  PROT 6.8  ALBUMIN 3.7    Recent Labs  03/27/16 1559 03/28/16 0440  WBC 6.9 7.5  HGB 13.8 13.2  HCT 39.4* 37.5*  MCV 99.5 100.1*  PLT 185 177    Recent Labs  03/29/16 1252 03/29/16 1932 03/30/16 0120 03/30/16 0753  TROPONINI 0.04* 0.03* 0.04* 0.04*   Invalid input(s): POCBNP  Recent Labs  03/28/16 1319  HGBA1C 5.2     Weights: Filed Weights   03/27/16 1557 03/27/16 2022  Weight: 152  lb (68.9 kg) 155 lb (70.3 kg)     Radiology/Studies:  Ct Head Wo Contrast  Result Date: 03/28/2016 CLINICAL DATA:  Confusion. EXAM: CT HEAD WITHOUT CONTRAST TECHNIQUE: Contiguous axial images were obtained from the base of the skull through the vertex without intravenous contrast. COMPARISON:  None. FINDINGS: Brain: There is atrophy and chronic small vessel disease changes. Associated ventriculomegaly, likely related to ex vacuo dilatation. No acute intracranial abnormality. Specifically, no hemorrhage, hydrocephalus, mass lesion, acute infarction, or significant intracranial injury. Vascular: No hyperdense vessel or unexpected calcification. Skull: No acute calvarial abnormality. Sinuses/Orbits: Mucosal thickening and opacification of scattered ethmoid air cells. No air-fluid levels. Mastoid air cells are clear. Orbital soft tissues unremarkable. Other: None IMPRESSION: No acute intracranial abnormality. Atrophy, chronic microvascular disease. Electronically Signed   By: Charlett NoseKevin  Dover M.D.   On: 03/28/2016 14:38   Ct Angio Chest Pe W Or Wo Contrast  Result Date: 03/28/2016 CLINICAL DATA:  Weakness. Nausea, vomiting, dizziness,  and not feeling well for the past 2 days. Increased confusion. EXAM: CT ANGIOGRAPHY CHEST WITH CONTRAST TECHNIQUE: Multidetector CT imaging of the chest was performed using the standard protocol during bolus administration of intravenous contrast. Multiplanar CT image reconstructions and MIPs were obtained to evaluate the vascular anatomy. CONTRAST:  75 mL Isovue 370 COMPARISON:  Chest and abdominal radiographs 03/27/2016 FINDINGS: Cardiovascular: Pulmonary arterial opacification is adequate without evidence of emboli. Subsegmental assessment is limited by motion artifact. Thoracic aortic atherosclerosis and mild ectasia. Right-sided cardiac chamber enlargement. Coronary artery atherosclerosis. No pericardial effusion. Mediastinum/Nodes: No enlarged axillary lymph nodes. A few borderline enlarged right hilar and paratracheal lymph nodes measure up to 1.1 cm in short axis. Unremarkable thyroid and trachea. Moderate-sized sliding hiatal hernia. Lungs/Pleura: Trace pleural effusions. Mild patchy ground-glass opacities in the left greater than right lower lobes. Upper Abdomen: Small stones in the gallbladder. Musculoskeletal: Suspected 1.7 cm intraarticular loose body in the subcoracoid recess of the right shoulder joint. Old posterior rib fractures bilaterally. Vertebral compression fractures at C7, T2, T3, T6, T7, T11, and L1. Height loss is severe at T11 and L1 and moderately severe at T7. Review of the MIP images confirms the above findings. IMPRESSION: 1. No evidence of pulmonary emboli. 2. Mild left greater than right lower lobe ground-glass opacities which may reflect atypical infection. 3. Trace pleural effusions. 4. Moderate-sized hiatal hernia. 5. Aortic atherosclerosis. 6. Numerous spinal compression fractures of indeterminate age. Electronically Signed   By: Sebastian AcheAllen  Grady M.D.   On: 03/28/2016 14:53   Dg Abd Acute W/chest  Result Date: 03/27/2016 CLINICAL DATA:  Dehydration and dizziness EXAM: DG ABDOMEN  ACUTE W/ 1V CHEST COMPARISON:  November 07, 2012 chest radiograph FINDINGS: PA chest: There is no edema or consolidation. Heart is mildly enlarged with pulmonary vascularity within normal limits. Aorta is tortuous with atherosclerotic calcification in the aorta. There is degenerative change in each shoulder. Supine and upright abdomen: There is moderate stool throughout the colon. There is no bowel dilatation or air-fluid level suggesting bowel obstruction. No free air. Bones are osteoporotic. There is marked collapse of the L1 vertebral body. IMPRESSION: No bowel obstruction or free air. No lung edema or consolidation. Aortic atherosclerosis present. Mild cardiomegaly. Marked collapse of the L1 vertebral body. Electronically Signed   By: Bretta BangWilliam  Woodruff III M.D.   On: 03/27/2016 18:32     Assessment and Plan  Principal Problem:   Hyponatremia Active Problems:   Alzheimer's dementia   Nausea and vomiting   Atrial flutter (HCC)  UTI (urinary tract infection)   Pneumonia   Constipation   Dysphagia    1. Newly diagnosed atrial flutter with RVR: -Rates are reasonably well controlled into the 90's to low 100's bpm -Asymptomatic -Metoprolol was changed to short-acting diltiazem given his persistent wheezing -Titrate diltiazem to 60 mg q 8 hours for heart rate control -Not on full-dose anticoagulation given his dementia as well as frequent falls -Continue ASA -If DCCV is needed for heart rate control he will require 1 month of full-dose anticoagulation. Not a long term full-dose anticoagulation candidate given his frequent falls and dementia -Unlikely to be a candidate for atrial flutter ablation as above -CHADS2VASc at least 2 (age x 2) -He is aware of increased stroke risk  2. Demand ischemia: -Troponin not trending up after checking x 10, order discontinued -No symptoms concerning for ischemia -His minimally elevated and flat trending troponin is likely 2/2 atrial flutter with RVR -No  need to continue to cycle any further in this asymptomatic patient with dementia and high fall risk  3. Nausea/vomiting/hyponatremia: -Improving -Per IM  4. Dementia: -Sitter in room -Per IM  5. Frequent falls: -Sitter in room -Needs PT eval  6. Valvular heart disease: -Uncertain etiology at this time given workup thus far -Continue to monitor   Signed, Eula Listenyan Engelbert Sevin, PA-C [facility] Pager: [phone] 03/30/2016, 8:57 AM

## 2016-03-30 NOTE — Care Management (Addendum)
Spoke with grandson per telephone concerning Home Health preference. States he usually lets his grandfather decide on "that stuff."  [facility], Johnathan GottronJason Foster, [facility] representative updated. Primary Care person is [facility]ynn Lam [facility].  Discharge to home today per Dr. Winona LegatoVaickute. Johnathan GreetBrenda S Chanette Demo RN MSN [facility] Care Management [phone]

## 2016-03-30 NOTE — Progress Notes (Signed)
Patient discharged home with home health. All discharge instructions given to patient and grandson and all questions answered. Escorted by Agricultural consultantvolunteer.

## 2016-04-06 LAB — SUSCEPTIBILITY RESULT

## 2016-04-06 LAB — URINE CULTURE
Culture: >=100000 — AB
SPECIAL REQUESTS: NORMAL

## 2016-04-06 LAB — SUSCEPTIBILITY, AER + ANAEROB

## 2016-04-10 NOTE — Progress Notes (Signed)
Follow-up Outpatient Visit Date: 04/11/2016  Chief Complaint: Hospital follow-up with atrial flutter  HPI:  [name] is a 82 y.o. year-old male with history of advanced Alzheimer's dementia and BPH, who presents for follow-up after recent hospitalization for nausea, vomiting, and malaise.  The patient was found to be in atrial flutter with variable block in the setting of severe hyponatremia.  He was initiated on rate control with beta-blockade, though this was switched to diltiazem prior to discharge due to suboptimal rate control and possible respiratory side-effects.  He was not started on anticoagulation due to his dementia and history of multiple falls.  CT chest during the hospitalization was notable for possible atypical pneumonia, as well as a moderate-sized hiatal hernia.  Echocardiogram showed mild LVH with normal LVEF but significant right atrial and ventricular enlargement.  Mild to moderate aortic regurgitation was also evident. Since leaving the hospital, [name] has continued to have frequent nausea with poor appetite. His son believes a lot of this may be due to problems with his dentures. He has not had any vomiting, as well as chest pain, shortness of breath, palpitations, edema, orthopnea, and PND. He notes occasional dizziness and balance problems, which have been present for many years. He has not fallen since he left the hospital. His family reports that he is never alone and has been compliant with his medications. He has not had any significant bleeding. A nurse is also coming to the house a few days per week to check on him.  --------------------------------------------------------------------------------------------------  Cardiovascular History & Procedures: Cardiovascular Problems:  Atrial flutter  Right ventricular/atrial enlargement  Aortic regurgitation  Risk Factors:  Age and male gender  Cath/PCI:  None  CV Surgery:  None  EP Procedures  and Devices:  None  Non-Invasive Evaluation(s):  TTE (03/28/16): Normal LV size with mild LVH.  LVEF 55-60% without regional wall motion abnormalities.  Mild to moderate AI.  MAC with mild mitral valve thickening; mild MR.  Mildly dilated LA.  RV moderately to severely dilated with normal wall thickness and contraction.  RA severely dilated.  Moderate to severe TR.  Upper normal to mildly elevated PA pressure.  Recent CV Pertinent Labs: Lab Results  Component Value Date   CHOL 153 03/28/2016   HDL 56 03/28/2016   LDLCALC 89 03/28/2016   TRIG 42 03/28/2016   CHOLHDL 2.7 03/28/2016   K 3.9 03/28/2016   MG 2.0 03/28/2016   BUN 18 03/28/2016   CREATININE 0.97 03/30/2016    Past medical and surgical history were reviewed and updated in EPIC.   Outpatient Encounter Prescriptions as of 04/11/2016  Medication Sig  . aspirin 325 MG tablet Take 325 mg by mouth daily.  . Calcium Carbonate-Vitamin D (CALCIUM-VITAMIN D) 600-125 MG-UNIT TABS Take 1 tablet by mouth daily.  Marland Kitchen. diltiazem (CARDIZEM CD) 120 MG 24 hr capsule Take 1 capsule (120 mg total) by mouth daily.  Marland Kitchen. docusate sodium (COLACE) 100 MG capsule Take 1 capsule (100 mg total) by mouth 2 (two) times daily.  . feeding supplement, ENSURE ENLIVE, (ENSURE ENLIVE) LIQD Take 237 mLs by mouth 2 (two) times daily between meals.  Marland Kitchen. levofloxacin (LEVAQUIN) 500 MG tablet Take 1 tablet (500 mg total) by mouth daily.  . ondansetron (ZOFRAN) 4 MG tablet Take 1 tablet (4 mg total) by mouth every 8 (eight) hours as needed for nausea or vomiting.  . phenol-menthol (CEPASTAT) 14.5 MG lozenge Place 1 lozenge inside cheek as needed for sore throat.  .Marland Kitchen  polyethylene glycol (MIRALAX / GLYCOLAX) packet Take 17 g by mouth daily.   No facility-administered encounter medications on file as of 04/11/2016.     Allergies: Patient has no known allergies.  Social History   Social History  . Marital status: Widowed    Spouse name: N/A  . Number of children:  4  . Years of education: N/A   Occupational History  . Not on file.   Social History Main Topics  . Smoking status: Former Smoker    Packs/day: 1.00    Years: 20.00    Types: Cigarettes    Quit date: 05/29/1956  . Smokeless tobacco: Never Used  . Alcohol use No  . Drug use: No  . Sexual activity: Not on file   Other Topics Concern  . Not on file   Social History Narrative   Patient lives at home with his son.   Caffeine Use: none    Family History  Problem Relation Age of Onset  . CAD Father   . Cancer Sister   . Cancer Sister   . Kidney disease Neg Hx   . Prostate cancer Neg Hx     Review of Systems: A 12-system review of systems was performed and was negative except as noted in the HPI.  --------------------------------------------------------------------------------------------------  Physical Exam: BP 120/80 (BP Location: Left Arm, Patient Position: Sitting, Cuff Size: Normal)   Pulse 91   Ht 5\' 8"  (1.727 m)   Wt 145 lb 8 oz (66 kg)   BMI 22.12 kg/m   General:  Frail, elderly man seated comfortably on the exam table. He is accompanied by his son. HEENT: No conjunctival pallor or scleral icterus.  Moist mucous membranes.  OP clear. Hard of hearing. Neck: Supple without lymphadenopathy, thyromegaly, JVD, or HJR.  Lungs: Normal work of breathing.  Clear to auscultation bilaterally without wheezes or crackles. Heart: Irregular without murmurs, rubs, or gallops.  Non-displaced PMI. Abd: Bowel sounds present.  Soft, NT/ND without hepatosplenomegaly Ext: No lower extremity edema.  Radial, PT, and DP pulses are 2+ bilaterally. Skin: warm and dry without rash  EKG:  Atrial flutter with variable conduction (ventricular rate 91 bpm). Non-specific ST/T changes.  Lab Results  Component Value Date   WBC 7.5 03/28/2016   HGB 13.2 03/28/2016   HCT 37.5 (L) 03/28/2016   MCV 100.1 (H) 03/28/2016   PLT 177 03/28/2016    Lab Results  Component Value Date   NA 133  (L) 03/30/2016   K 3.9 03/28/2016   CL 97 (L) 03/28/2016   CO2 26 03/28/2016   BUN 18 03/28/2016   CREATININE 0.97 03/30/2016   GLUCOSE 86 03/28/2016   ALT 30 03/27/2016    Lab Results  Component Value Date   CHOL 153 03/28/2016   HDL 56 03/28/2016   LDLCALC 89 03/28/2016   TRIG 42 03/28/2016   CHOLHDL 2.7 03/28/2016    --------------------------------------------------------------------------------------------------  ASSESSMENT AND PLAN: Atrial flutter  Patient remains in atrial flutter with adequate rate control. He has a long-standing history of dizziness and balance problems that predate his atrial flutter. He has a history of multiple falls in the past, some leading to broken bones. We have again discussed the risk for thromboembolism with atrial flutter and have agreed to continue with aspirin, given his high fall risk and frailty. We will decrease ASA from 325 mg daily to 81 mg daily. We will also continue with his current dose of diltiazem for rate control. Given that he is not  a good candidate for anticoagulation, we will not proceed with cardioversion or flutter ablation.  Nausea and decreased appetite This is likely multifactorial. Hiatal hernia was noted on recent CT chest. I suggested that the patient consider trying an OTC antacid such as famotidine. His dentures may also be playing a role in his decreased PO intake, which his family is currently working on.  Right atrial/ventricular enlargement Patient appears euvolemic on exam. CTA chest during recent hospitalizaiton did not show evidence of PE.  Suspect this is due to underlying lung disease. We will not make any changes today.  Aortic regurgitation No evidence of heart failure. Blood pressure is reasonable today. We will not make any changes today.  Follow-up: Return to clinic in 3 months.  Yvonne Kendallhristopher Fountain Derusha, MD 04/11/2016 12:10 PM

## 2016-04-11 ENCOUNTER — Encounter: Payer: Self-pay | Admitting: Internal Medicine

## 2016-04-11 ENCOUNTER — Ambulatory Visit (INDEPENDENT_AMBULATORY_CARE_PROVIDER_SITE_OTHER): Payer: Medicare Other | Admitting: Internal Medicine

## 2016-04-11 VITALS — BP 120/80 | HR 91 | Ht 68.0 in | Wt 145.5 lb

## 2016-04-11 DIAGNOSIS — I351 Nonrheumatic aortic (valve) insufficiency: Secondary | ICD-10-CM | POA: Diagnosis not present

## 2016-04-11 DIAGNOSIS — I517 Cardiomegaly: Secondary | ICD-10-CM

## 2016-04-11 DIAGNOSIS — R110 Nausea: Secondary | ICD-10-CM

## 2016-04-11 DIAGNOSIS — I4892 Unspecified atrial flutter: Secondary | ICD-10-CM | POA: Diagnosis not present

## 2016-04-11 MED ORDER — ASPIRIN EC 81 MG PO TBEC: 81.0000 mg | DELAYED_RELEASE_TABLET | Freq: Every day | ORAL | 3 refills | Status: AC

## 2016-04-11 NOTE — Patient Instructions (Signed)
Medication Instructions:  Your physician has recommended you make the following change in your medication:  1- DECREASE Aspirin to 81 mg by mouth once a day.  Dr End suggests you try over-the-counter antacid such as Pepcid or Zantac.   Follow-Up: Your physician recommends that you schedule a follow-up appointment in: 3 MONTHS WITH DR END.   If you need a refill on your cardiac medications before your next appointment, please call your pharmacy.

## 2016-04-12 ENCOUNTER — Encounter: Payer: Self-pay | Admitting: Internal Medicine

## 2016-04-27 ENCOUNTER — Ambulatory Visit: Payer: Medicare Other | Admitting: Neurology

## 2016-04-27 ENCOUNTER — Telehealth: Payer: Self-pay

## 2016-04-27 NOTE — Telephone Encounter (Signed)
Pt no-showed his follow-up appt this morning. 

## 2016-05-01 ENCOUNTER — Encounter: Payer: Self-pay | Admitting: Neurology

## 2016-05-17 ENCOUNTER — Ambulatory Visit (INDEPENDENT_AMBULATORY_CARE_PROVIDER_SITE_OTHER): Payer: Medicare Other | Admitting: Neurology

## 2016-05-17 ENCOUNTER — Encounter: Payer: Self-pay | Admitting: Neurology

## 2016-05-17 VITALS — BP 110/62 | HR 84 | Resp 18 | Ht 65.0 in | Wt 146.0 lb

## 2016-05-17 DIAGNOSIS — R269 Unspecified abnormalities of gait and mobility: Secondary | ICD-10-CM

## 2016-05-17 DIAGNOSIS — R413 Other amnesia: Secondary | ICD-10-CM | POA: Diagnosis not present

## 2016-05-17 MED ORDER — MEMANTINE HCL 28 X 5 MG & 21 X 10 MG PO TABS: ORAL_TABLET | ORAL | 0 refills | Status: AC

## 2016-05-17 NOTE — Progress Notes (Signed)
Reason for visit: Alzheimer's disease  Johnathan Foster is an 82 y.o. male  History of present illness:  Johnathan Foster is an 82 year old right-handed white male with a history of a progressive memory disturbance. The patient also has some gait instability. He lives in a home with a family member who helps take care of him. Other family members live within walking distance to his house. The patient requires assistance with keeping up with medications and appointments, keeping up with finances, and he needs some help with bathing. The patient still likes to stay active, he will cut wood, and do some maintenance work. The patient reports that he does have trouble with misplacing things about the house, he cannot keep up with money. No longer operates a motor vehicle. The patient is staying safe, he has not had any recent falls. The patient does have a cane, but he rarely uses this. He returns to this office for an evaluation. Since starting the Aricept, the patient has had a significant reduction in appetite, he has had some weight loss. Our records indicate only a 2 pound weight loss since May 2017. The family indicates that he has lost almost 9 pounds within the last 3 or 4 months. The patient recently was in the hospital around 03/27/2016 with hyponatremia with a sodium level of 118. The patient was feeling generally weak, but he has returned to his usual baseline.  Past Medical History:  Diagnosis Date  . Abnormality of gait 12/24/2014  . Alzheimer's dementia   . BPH (benign prostatic hyperplasia)   . Fatigue   . Lower urinary tract symptoms   . Memory deficits 12/24/2014  . Memory loss     Past Surgical History:  Procedure Laterality Date  . BONY PELVIS SURGERY    . FOOT SURGERY    . ORIF ELBOW FRACTURE Left 06/22/2015   Procedure: OPEN REDUCTION INTERNAL FIXATION (ORIF) ELBOW/OLECRANON FRACTURE;  Surgeon: Johnathan FlakeJohn J Poggi, MD;  Location: [facility] ORS;  Service: Orthopedics;  Laterality: Left;      Family History  Problem Relation Age of Onset  . CAD Father   . Cancer Sister   . Cancer Sister   . Kidney disease Neg Hx   . Prostate cancer Neg Hx     Social history:  reports that he quit smoking about 60 years ago. His smoking use included Cigarettes. He has a 20.00 pack-year smoking history. He has never used smokeless tobacco. He reports that he does not drink alcohol or use drugs.   No Known Allergies  Medications:  Prior to Admission medications   Medication Sig Start Date End Date Taking? Authorizing Provider  aspirin EC 81 MG tablet Take 1 tablet (81 mg total) by mouth daily. 04/11/16  Yes Yvonne Kendallhristopher End, MD  Calcium Carbonate-Vitamin D (CALCIUM-VITAMIN D) 600-125 MG-UNIT TABS Take 1 tablet by mouth daily.   Yes Historical Provider, MD  diltiazem (CARDIZEM CD) 120 MG 24 hr capsule Take 1 capsule (120 mg total) by mouth daily. 03/30/16  Yes Katharina Caperima Vaickute, MD  feeding supplement, ENSURE ENLIVE, (ENSURE ENLIVE) LIQD Take 237 mLs by mouth 2 (two) times daily between meals. 03/30/16  Yes Katharina Caperima Vaickute, MD    ROS:  Out of a complete 14 system review of symptoms, the patient complains only of the following symptoms, and all other reviewed systems are negative.  Decreased appetite Runny nose, trouble swallowing Cough, wheezing, shortness of breath Palpitations of the heart Frequent waking Frequency of urination Gait instability Memory loss,  dizziness Confusion, depression  Blood pressure 110/62, pulse 84, resp. rate 18, height 5\' 5"  (1.651 m), weight 146 lb (66.2 kg).  Physical Exam  General: The patient is alert and cooperative at the time of the examination.  Skin: No significant peripheral edema is noted.   Neurologic Exam  Mental status: The patient is alert and oriented x 2 at the time of the examination (not oriented to date). The Mini-Mental Status Examination done today shows a total score of 17/3.Marland Kitchen.   Cranial nerves: Facial symmetry is present. Speech  is normal, no aphasia or dysarthria is noted. Extraocular movements are full. Visual fields are full.  Motor: The patient has good strength in all 4 extremities.  Sensory examination: Soft touch sensation is symmetric on the face, arms, and legs.  Coordination: The patient has good finger-nose-finger and heel-to-shin bilaterally.  Gait and station: The patient has a slightly wide-based, unsteady gait. Tandem gait was not attempted. Romberg is negative. No drift is seen.  Reflexes: Deep tendon reflexes are symmetric.   Assessment/Plan:   1 Progressive memory disturbance  2. Gait disturbance  The patient has had a reduction in appetite, the family believes that he is losing weight actively, but our records do not confirm this. The patient will be taken off of Aricept, he will be switched to Namenda, the family will call me in one month if he is doing well on this prescription to obtain the maintenance dose prescription. The patient will follow-up in 6 months, sooner if needed.  [name] MD 05/17/2016 8:01 AM  [facility] [address] [place], [place] [id number]  Phone [phone] Fax [phone]

## 2016-05-17 NOTE — Patient Instructions (Signed)
   We will try Namenda for the memory and stop the Aricept. Watch out for dizziness. Call in one month if he is tolerating the drug for a new prescription.

## 2016-07-07 ENCOUNTER — Other Ambulatory Visit: Payer: Self-pay | Admitting: Adult Health

## 2016-07-12 ENCOUNTER — Ambulatory Visit: Payer: Medicare Other | Admitting: Internal Medicine

## 2016-07-12 ENCOUNTER — Encounter: Payer: Self-pay | Admitting: *Deleted

## 2016-07-18 ENCOUNTER — Ambulatory Visit (INDEPENDENT_AMBULATORY_CARE_PROVIDER_SITE_OTHER): Payer: Medicare Other | Admitting: Urology

## 2016-07-18 ENCOUNTER — Encounter: Payer: Self-pay | Admitting: Urology

## 2016-07-18 VITALS — BP 145/92 | HR 106 | Ht 65.0 in | Wt 153.0 lb

## 2016-07-18 DIAGNOSIS — N401 Enlarged prostate with lower urinary tract symptoms: Secondary | ICD-10-CM

## 2016-07-18 DIAGNOSIS — N138 Other obstructive and reflux uropathy: Secondary | ICD-10-CM | POA: Diagnosis not present

## 2016-07-18 LAB — BLADDER SCAN AMB NON-IMAGING: Scan Result: 105

## 2016-07-18 MED ORDER — TAMSULOSIN HCL 0.4 MG PO CAPS: 0.4000 mg | ORAL_CAPSULE | Freq: Every day | ORAL | 12 refills | Status: AC

## 2016-07-18 NOTE — Progress Notes (Signed)
07/18/2016 10:58 AM   Johnathan Foster [birth year] [id number]  Referring provider: [name], NP [address] [place], [place] [id number]  Chief Complaint  Patient presents with  . Benign Prostatic Hypertrophy    HPI: Patient is an 83 year old Caucasian male who presents today for an annual visit for his BPH with LUTS.  His Alzheimer's is becoming more severe, so he is a poor historian.  He does present with his grandson.    BPH WITH LUTS His IPSS score today is 8, which is moderate lower urinary tract symptomatology.  He is mixed with his quality life due to his urinary symptoms.  His PVR is 105 mL.  His major complaint today dribbling and a weak urinary stream.  He has had these symptoms for over the last year.  He denies any dysuria, hematuria or suprapubic pain.  He also denies any recent fevers, chills, nausea or vomiting.  He does not have a family history of PCa.      IPSS    Row Name 07/18/16 1000         International Prostate Symptom Score   How often have you had the sensation of not emptying your bladder? Less than 1 in 5     How often have you had to urinate less than every two hours? Less than half the time     How often have you found you stopped and started again several times when you urinated? Not at All     How often have you found it difficult to postpone urination? About half the time     How often have you had a weak urinary stream? Not at All     How often have you had to strain to start urination? Not at All     How many times did you typically get up at night to urinate? 2 Times     Total IPSS Score 8       Quality of Life due to urinary symptoms   If you were to spend the rest of your life with your urinary condition just the way it is now how would you feel about that? Mixed        Score:  1-7 Mild 8-19 Moderate 20-35 Severe     PMH: Past Medical History:  Diagnosis Date  . Abnormality of gait 12/24/2014  . Alzheimer's dementia   .  BPH (benign prostatic hyperplasia)   . Fatigue   . Lower urinary tract symptoms   . Memory deficits 12/24/2014  . Memory loss     Surgical History: Past Surgical History:  Procedure Laterality Date  . BONY PELVIS SURGERY    . FOOT SURGERY    . ORIF ELBOW FRACTURE Left 06/22/2015   Procedure: OPEN REDUCTION INTERNAL FIXATION (ORIF) ELBOW/OLECRANON FRACTURE;  Surgeon: Johnathan FlakeJohn J Poggi, MD;  Location: [facility] OR[facility];  Service: Orthopedics;  Laterality: Left;    Home Medications:  Allergies as of 07/18/2016   No Known Allergies     Medication List       Accurate as of 07/18/16 10:58 AM. Always use your most recent med list.          aspirin EC 81 MG tablet Take 1 tablet (81 mg total) by mouth daily.   Calcium-Vitamin D 600-125 MG-UNIT Tabs Take 1 tablet by mouth daily.   diltiazem 120 MG 24 hr capsule Commonly known as:  CARDIZEM CD Take 1 capsule (120 mg total) by mouth daily.  feeding supplement (ENSURE ENLIVE) Liqd Take 237 mLs by mouth 2 (two) times daily between meals.   memantine tablet pack Commonly known as:  NAMENDA TITRATION PAK 5 mg/day for =1 week; 5 mg twice daily for =1 week; 15 mg/day given in 5 mg and 10 mg separated doses for =1 week; then 10 mg twice daily       Allergies: No Known Allergies  Family History: Family History  Problem Relation Age of Onset  . CAD Father   . Cancer Sister   . Cancer Sister   . Kidney disease Neg Hx   . Prostate cancer Neg Hx     Social History:  reports that he quit smoking about 60 years ago. His smoking use included Cigarettes. He has a 20.00 pack-year smoking history. He has never used smokeless tobacco. He reports that he does not drink alcohol or use drugs.  ROS: UROLOGY Frequent Urination?: No Hard to postpone urination?: No Burning/pain with urination?: No Get up at night to urinate?: No Leakage of urine?: No Urine stream starts and stops?: No Trouble starting stream?: No Do you have to strain to urinate?:  No Blood in urine?: No Urinary tract infection?: No Sexually transmitted disease?: No Injury to kidneys or bladder?: No Painful intercourse?: No Weak stream?: No Erection problems?: No Penile pain?: No  Gastrointestinal Nausea?: No Vomiting?: No Indigestion/heartburn?: No Diarrhea?: No Constipation?: No  Constitutional Fever: No Night sweats?: No Weight loss?: No Fatigue?: No  Skin Skin rash/lesions?: No Itching?: No  Eyes Blurred vision?: No Double vision?: No  Ears/Nose/Throat Sore throat?: No Sinus problems?: No  Hematologic/Lymphatic Swollen glands?: No Easy bruising?: No  Cardiovascular Leg swelling?: No Chest pain?: No  Respiratory Cough?: No Shortness of breath?: No  Endocrine Excessive thirst?: No  Musculoskeletal Back pain?: No Joint pain?: No  Neurological Headaches?: No Dizziness?: No  Psychologic Depression?: No Anxiety?: No  Physical Exam: BP (!) 145/92 (BP Location: Left Arm, Patient Position: Sitting, Cuff Size: Normal)   Pulse (!) 106   Ht 5\' 5"  (1.651 m)   Wt 153 lb (69.4 kg)   BMI 25.46 kg/m   Constitutional: Well nourished. Alert and oriented, No acute distress. HEENT: [place] AT, moist mucus membranes. Trachea midline, no masses. Cardiovascular: No clubbing, cyanosis, or edema. Respiratory: Normal respiratory effort, no increased work of breathing. GI: Abdomen is soft, non tender, non distended, no abdominal masses. Liver and spleen not palpable.  No hernias appreciated.  Stool sample for occult testing is not indicated.   GU: No CVA tenderness.  No bladder fullness or masses.   Skin: No rashes, bruises or suspicious lesions. Lymph: No cervical or inguinal adenopathy. Neurologic: Grossly intact, no focal deficits, moving all 4 extremities. Psychiatric: Normal mood and affect.  Laboratory Data: PSA History  1.6 ng/mL on 09/24/2012  Pertinent Imaging Results for Johnathan Foster (MRN [id number]) as of 07/18/2016  10:56  Ref. Range 07/18/2016 10:56  Scan Result Unknown 105    Assessment & Plan:    1. BPH with LUTS  - IPSS score is 8/3, it is stable/improving/worsening  - Continue conservative management, avoiding bladder irritants and timed voiding's  - patient tittered back and forth about wanting to start medication for his dribbling, I sent a prescription in for Flomax- he may or may not start the medication  - grandson states he stopped all his heart medications, so he most likely will not take any other medication  - RTC in 12 months for PVR and IPSS  Return in about 1 year (around 07/18/2017) for PVR and symptom recheck.  These notes generated with voice recognition software. I apologize for typographical errors.  Michiel Cowboy, PA-C  [facility] [address] [place], [place] [address] [phone]

## 2017-07-18 ENCOUNTER — Encounter: Payer: Self-pay | Admitting: Urology

## 2017-07-18 ENCOUNTER — Ambulatory Visit: Payer: Medicare Other | Admitting: Urology

## 2017-08-27 DEATH — deceased

## 2018-08-04 IMAGING — CT CT HEAD W/O CM
3 of 6 series · 15 of 47 positions shown, 18 images · non-contrast
Comparison: None.

CLINICAL DATA: Confusion.

EXAM:
CT HEAD WITHOUT CONTRAST
TECHNIQUE: Contiguous axial images were obtained from the base of the skull
through the vertex without intravenous contrast.

[Series 2: head wo · axial · 0.43mm/px · z∈[+1398,+1528]mm · 10 of 30 slices shown, 13 images]
[im 2/30  brain]
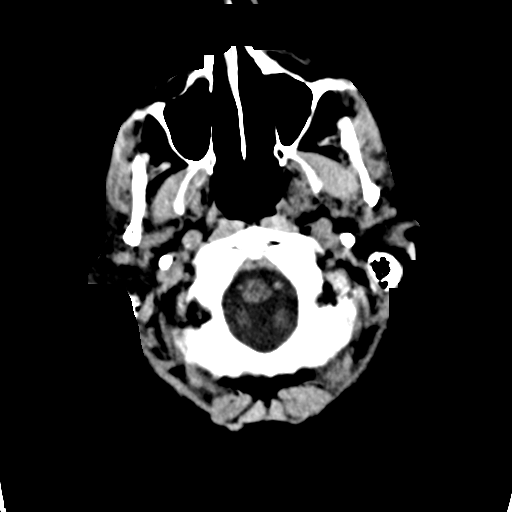
[im 2/30  bone]
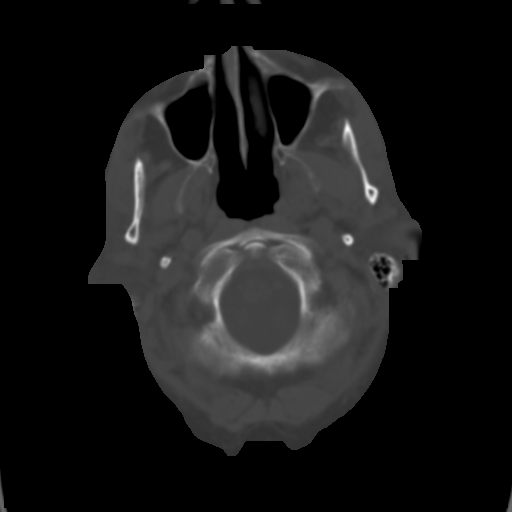
[im 6/30  brain]
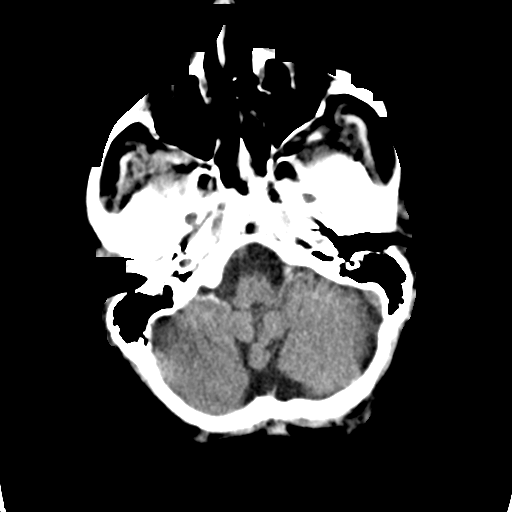
[im 8/30  brain]
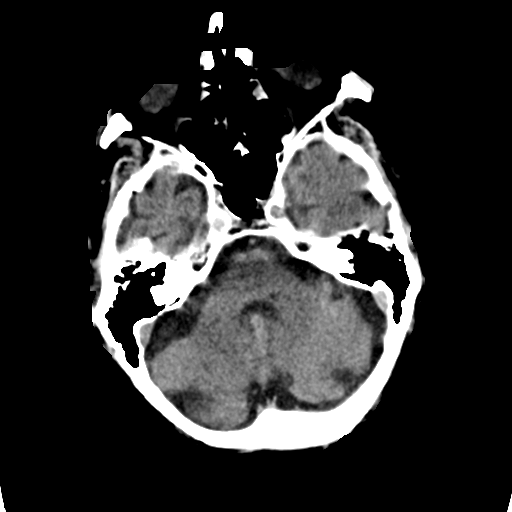
[im 11/30  brain]
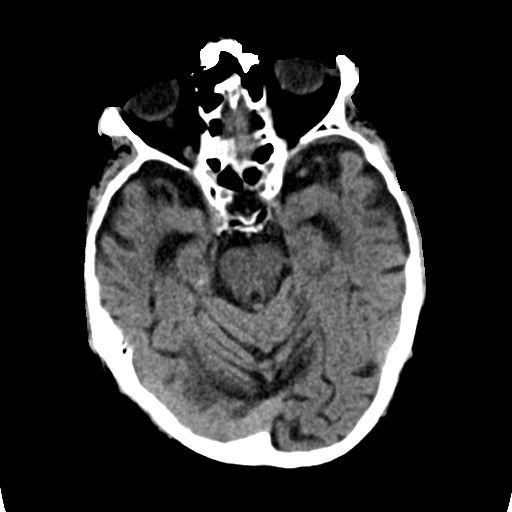
[im 13/30  brain]
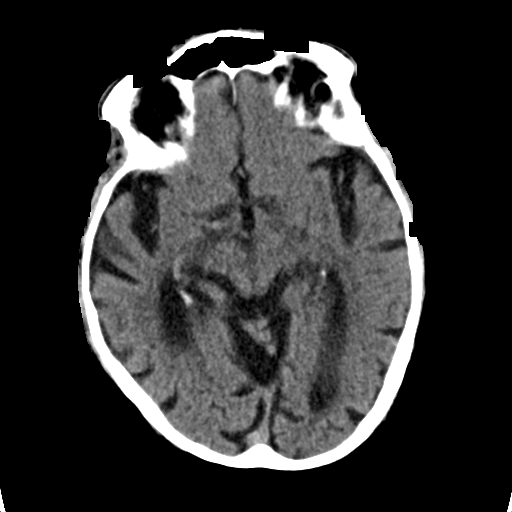
[im 13/30  bone]
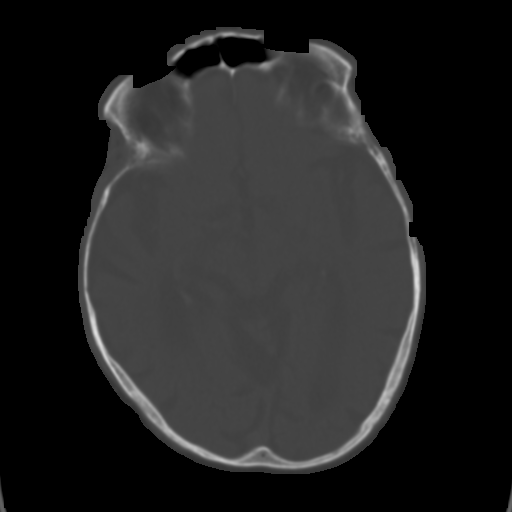
[im 17/30  brain]
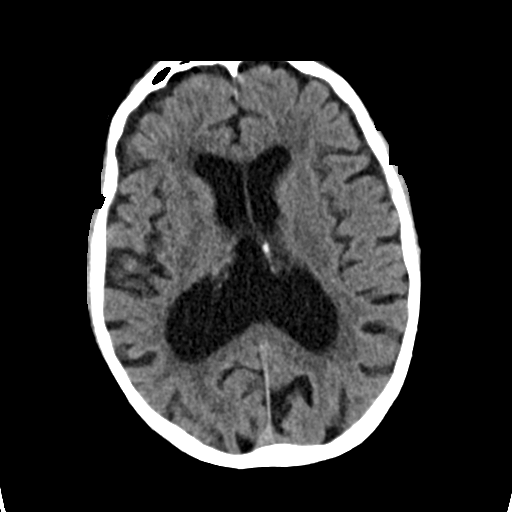
[im 19/30  brain]
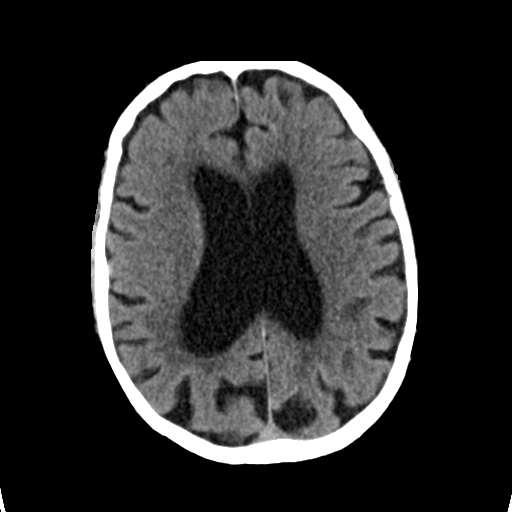
[im 22/30  brain]
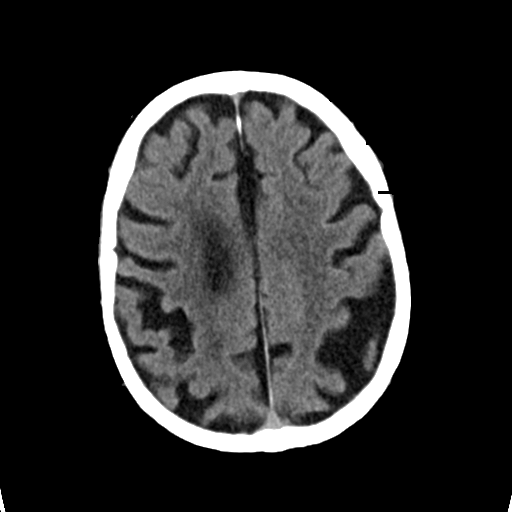
[im 24/30  brain]
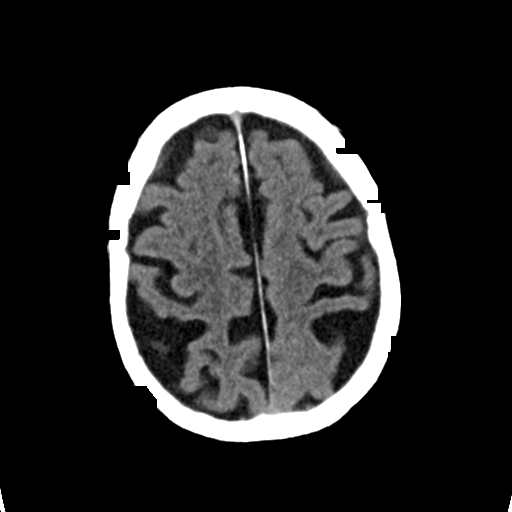
[im 24/30  bone]
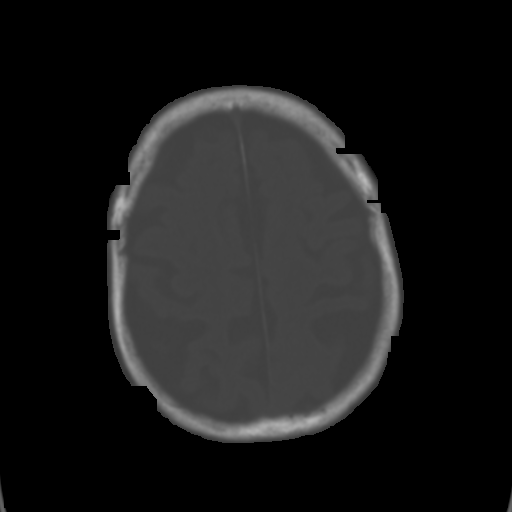
[im 28/30  brain]
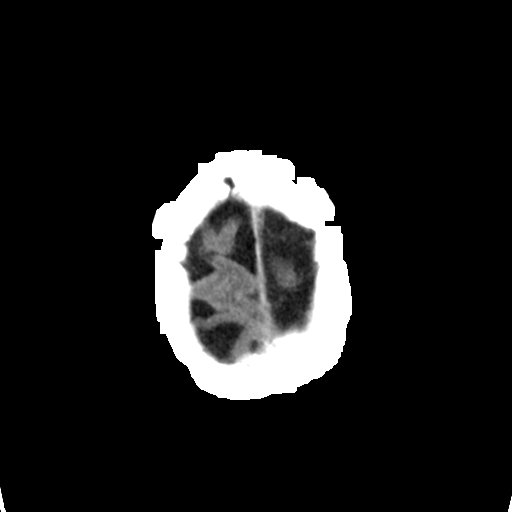

[Series 4: coronal soft tissue · coronal · 0.30mm/px · 3 of 67 slices shown]
[im 17/67  brain]
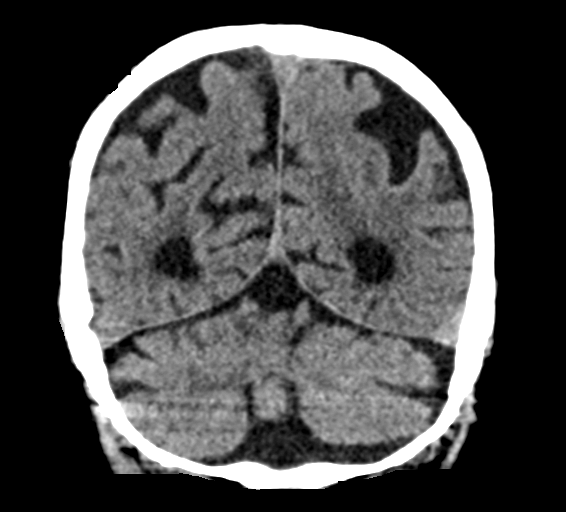
[im 34/67  brain]
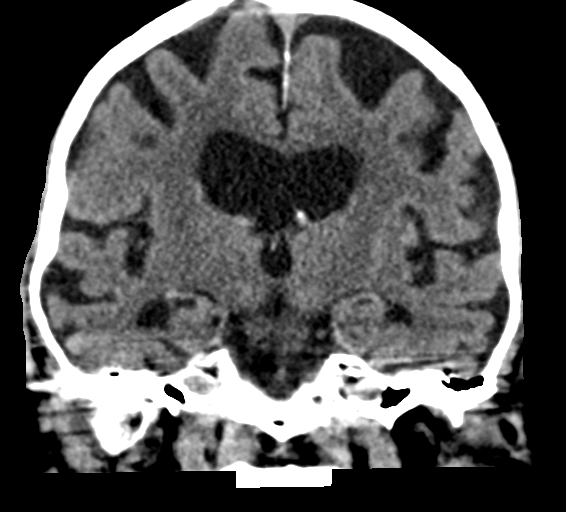
[im 50/67  brain]
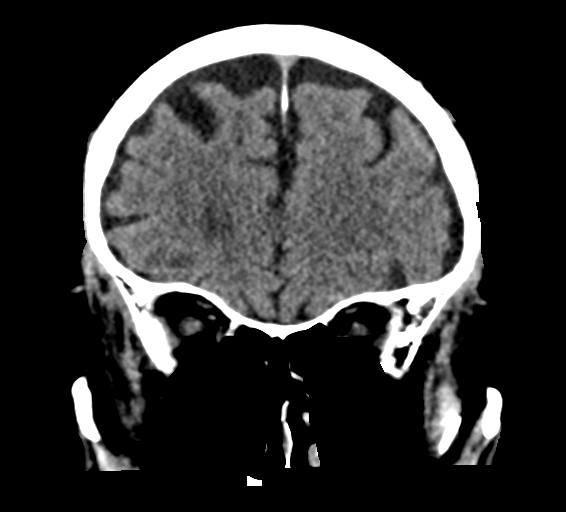

[Series 9: sagittal soft tissue · sagittal · 0.23mm/px · 2 of 53 slices shown]
[im 18/53  brain]
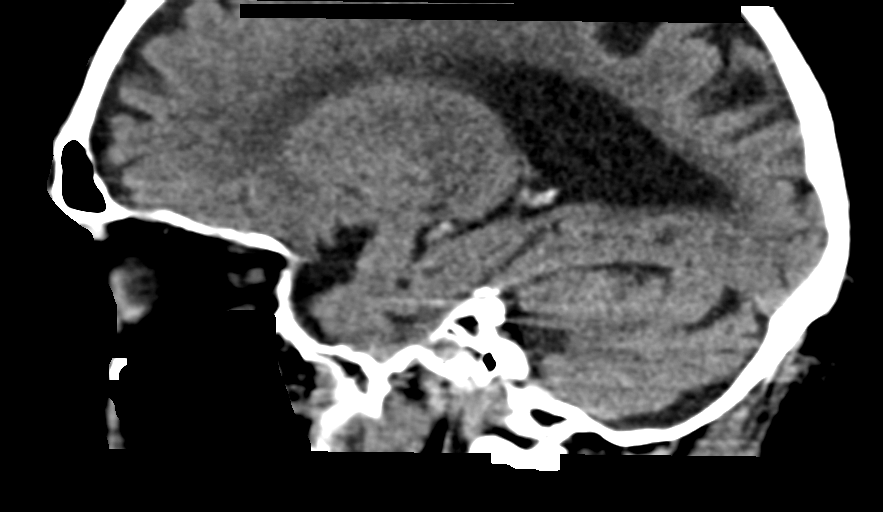
[im 35/53  brain]
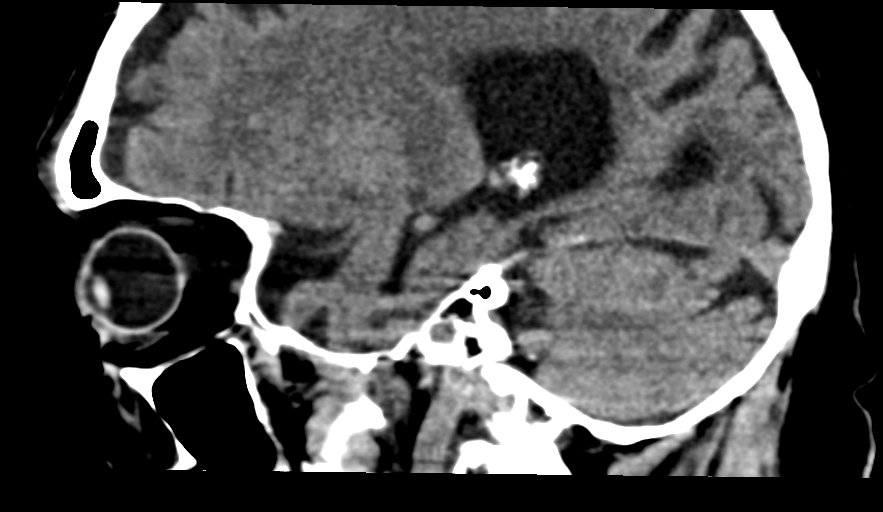

[15 of 47 positions shown; findings below may reference images not displayed]

FINDINGS: Brain: There is atrophy and chronic small vessel disease changes.
Associated ventriculomegaly, likely related to ex vacuo dilatation.
No acute intracranial abnormality. Specifically, no hemorrhage,
hydrocephalus, mass lesion, acute infarction, or significant
intracranial injury.

Vascular: No hyperdense vessel or unexpected calcification.

Skull: No acute calvarial abnormality.

Sinuses/Orbits: Mucosal thickening and opacification of scattered
ethmoid air cells. No air-fluid levels. Mastoid air cells are clear.
Orbital soft tissues unremarkable.

Other: None
IMPRESSION: No acute intracranial abnormality.

Atrophy, chronic microvascular disease.
